# Patient Record
Sex: Female | Born: 1995 | Hispanic: No | Marital: Single | Smoking: Never smoker
Health system: Southern US, Community
[De-identification: ages and names within clinical notes are randomized; demographics above are authoritative.]

## PROBLEM LIST (undated history)

## (undated) DIAGNOSIS — Z789 Other specified health status: Secondary | ICD-10-CM

## (undated) HISTORY — PX: EYE SURGERY: SHX253

---
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
## 2013-03-22 NOTE — L&D Delivery Note (Signed)
Delivery Summary for Erica SimpsonShamara T [name]  Labor Events:   Preterm labor:   Rupture date:   Rupture time:   Rupture type: Artificial  Fluid Color: Clear  Induction:   Augmentation:   Complications:   Cervical ripening:          Delivery:   Episiotomy:   Lacerations: 2nd degree  Repair suture: 3-0 monocryl  Repair # of packets: 1  Blood loss (ml): 200   Information for the patient's newborn:  Erica [name], Erica [name] [[id number]][[id number]]    Delivery 05/27/2013 6:24 AM by  Vaginal, Vacuum (Extractor) Sex:  female Gestational Age: 3029w5d Delivery Clinician:   Living?: Yes        APGARS  One minute Five minutes Ten minutes  Skin color: 2   2      Heart rate: 2   2      Grimace: 1   1      Muscle tone: 1   2      Breathing: 2   2      Totals: 8  9      Presentation/position:      Resuscitation: None  Cord information:    Disposition of cord blood:     Blood gases sent?  Complications:   Placenta: Delivered: 05/27/2013 6:26 AM  Spontaneous  Intact appearance Newborn Measurements: Weight: 7 lb 6.7 oz (3365 g)  Height: 20"  Head circumference: 34.3 cm  Chest circumference: 34.3 cm  Other providers:    Additional  information: Forceps:   Vacuum: Low  Breech:   Observed anomalies       Erica PhenixJames G Dima Mini, MD 05/27/2013

## 2013-05-26 ENCOUNTER — Encounter (HOSPITAL_COMMUNITY): Payer: Medicaid Other | Admitting: Anesthesiology

## 2013-05-26 ENCOUNTER — Inpatient Hospital Stay (HOSPITAL_COMMUNITY): Payer: Medicaid Other | Admitting: Anesthesiology

## 2013-05-26 ENCOUNTER — Inpatient Hospital Stay (HOSPITAL_COMMUNITY)
Admission: AD | Admit: 2013-05-26 | Discharge: 2013-05-28 | DRG: 775 | Disposition: A | Discharge status: Home / Self Care | Payer: Medicaid Other | Source: Ambulatory Visit | Attending: Obstetrics & Gynecology | Admitting: Obstetrics & Gynecology

## 2013-05-26 ENCOUNTER — Encounter (HOSPITAL_COMMUNITY): Payer: Self-pay | Admitting: *Deleted

## 2013-05-26 DIAGNOSIS — O411090 Infection of amniotic sac and membranes, unspecified, unspecified trimester, not applicable or unspecified: Secondary | ICD-10-CM | POA: Diagnosis present

## 2013-05-26 DIAGNOSIS — O9902 Anemia complicating childbirth: Secondary | ICD-10-CM

## 2013-05-26 DIAGNOSIS — D649 Anemia, unspecified: Secondary | ICD-10-CM | POA: Diagnosis present

## 2013-05-26 DIAGNOSIS — IMO0001 Reserved for inherently not codable concepts without codable children: Secondary | ICD-10-CM

## 2013-05-26 HISTORY — DX: Other specified health status: Z78.9

## 2013-05-26 LAB — CBC
HCT: 31.2 % — ABNORMAL LOW (ref 36.0–46.0)
Hemoglobin: 9.3 g/dL — ABNORMAL LOW (ref 12.0–15.0)
MCH: 21.9 pg — AB (ref 26.0–34.0)
MCHC: 29.8 g/dL — ABNORMAL LOW (ref 30.0–36.0)
MCV: 73.4 fL — AB (ref 78.0–100.0)
Platelets: 288 10*3/uL (ref 150–400)
RBC: 4.25 MIL/uL (ref 3.87–5.11)
RDW: 19.6 % — AB (ref 11.5–15.5)
WBC: 13.2 10*3/uL — ABNORMAL HIGH (ref 4.0–10.5)

## 2013-05-26 LAB — RAPID HIV SCREEN (WH-MAU): Rapid HIV Screen: NONREACTIVE

## 2013-05-26 LAB — OB RESULTS CONSOLE GBS: GBS: NEGATIVE

## 2013-05-26 LAB — HIV ANTIBODY (ROUTINE TESTING W REFLEX): HIV: NONREACTIVE

## 2013-05-26 LAB — RPR: RPR Ser Ql: NONREACTIVE

## 2013-05-26 MED ORDER — LACTATED RINGERS IV SOLN: 500.0000 mL | INTRAVENOUS | Status: DC | PRN

## 2013-05-26 MED ORDER — OXYTOCIN BOLUS FROM INFUSION
500.0000 mL | INTRAVENOUS | Status: DC
  Administered 2013-05-27: 500 mL via INTRAVENOUS

## 2013-05-26 MED ORDER — AMPICILLIN SODIUM 2 G IJ SOLR
2.0000 g | Freq: Four times a day (QID) | INTRAMUSCULAR | Status: DC
  Administered 2013-05-26 – 2013-05-27 (×2): 2 g via INTRAVENOUS
  Filled 2013-05-26 (×3): qty 2000

## 2013-05-26 MED ORDER — EPHEDRINE 5 MG/ML INJ: 10.0000 mg | INTRAVENOUS | Status: DC | PRN

## 2013-05-26 MED ORDER — FENTANYL CITRATE 0.05 MG/ML IJ SOLN
100.0000 ug | INTRAMUSCULAR | Status: DC | PRN
  Administered 2013-05-26 (×4): 100 ug via INTRAVENOUS
  Filled 2013-05-26 (×4): qty 2

## 2013-05-26 MED ORDER — FENTANYL 2.5 MCG/ML BUPIVACAINE 1/10 % EPIDURAL INFUSION (WH - ANES)
14.0000 mL/h | INTRAMUSCULAR | Status: DC | PRN
  Administered 2013-05-27: 14 mL/h via EPIDURAL
  Filled 2013-05-26 (×2): qty 125

## 2013-05-26 MED ORDER — LIDOCAINE HCL (PF) 1 % IJ SOLN
INTRAMUSCULAR | Status: DC | PRN
  Administered 2013-05-26 (×2): 4 mL

## 2013-05-26 MED ORDER — GENTAMICIN SULFATE 40 MG/ML IJ SOLN
140.0000 mg | Freq: Three times a day (TID) | INTRAVENOUS | Status: DC
  Filled 2013-05-26 (×2): qty 3.5

## 2013-05-26 MED ORDER — FENTANYL 2.5 MCG/ML BUPIVACAINE 1/10 % EPIDURAL INFUSION (WH - ANES)
INTRAMUSCULAR | Status: DC | PRN
  Administered 2013-05-26: 14 mL/h via EPIDURAL

## 2013-05-26 MED ORDER — CITRIC ACID-SODIUM CITRATE 334-500 MG/5ML PO SOLN: 30.0000 mL | ORAL | Status: DC | PRN

## 2013-05-26 MED ORDER — PHENYLEPHRINE 40 MCG/ML (10ML) SYRINGE FOR IV PUSH (FOR BLOOD PRESSURE SUPPORT): 80.0000 ug | PREFILLED_SYRINGE | INTRAVENOUS | Status: DC | PRN

## 2013-05-26 MED ORDER — LACTATED RINGERS IV SOLN
500.0000 mL | Freq: Once | INTRAVENOUS | Status: AC
  Administered 2013-05-26: 500 mL via INTRAVENOUS

## 2013-05-26 MED ORDER — OXYTOCIN 40 UNITS IN LACTATED RINGERS INFUSION - SIMPLE MED
62.5000 mL/h | INTRAVENOUS | Status: DC
Start: 2013-05-26 — End: 2013-05-27
  Filled 2013-05-26: qty 1000

## 2013-05-26 MED ORDER — GENTAMICIN SULFATE 40 MG/ML IJ SOLN
160.0000 mg | Freq: Once | INTRAVENOUS | Status: AC
  Administered 2013-05-26: 160 mg via INTRAVENOUS
  Filled 2013-05-26: qty 4

## 2013-05-26 MED ORDER — OXYCODONE-ACETAMINOPHEN 5-325 MG PO TABS: 1.0000 | ORAL_TABLET | ORAL | Status: DC | PRN

## 2013-05-26 MED ORDER — ACETAMINOPHEN 325 MG PO TABS
650.0000 mg | ORAL_TABLET | ORAL | Status: DC | PRN
  Administered 2013-05-26: 650 mg via ORAL
  Filled 2013-05-26: qty 2

## 2013-05-26 MED ORDER — PHENYLEPHRINE 40 MCG/ML (10ML) SYRINGE FOR IV PUSH (FOR BLOOD PRESSURE SUPPORT)
80.0000 ug | PREFILLED_SYRINGE | INTRAVENOUS | Status: DC | PRN
  Filled 2013-05-26: qty 10

## 2013-05-26 MED ORDER — LIDOCAINE HCL (PF) 1 % IJ SOLN
30.0000 mL | INTRAMUSCULAR | Status: DC | PRN
  Filled 2013-05-26 (×2): qty 30

## 2013-05-26 MED ORDER — LACTATED RINGERS IV SOLN
INTRAVENOUS | Status: DC
  Administered 2013-05-26 (×2): via INTRAVENOUS

## 2013-05-26 MED ORDER — EPHEDRINE 5 MG/ML INJ
10.0000 mg | INTRAVENOUS | Status: DC | PRN
  Filled 2013-05-26: qty 4

## 2013-05-26 MED ORDER — DIPHENHYDRAMINE HCL 50 MG/ML IJ SOLN: 12.5000 mg | INTRAMUSCULAR | Status: DC | PRN

## 2013-05-26 MED ORDER — ONDANSETRON HCL 4 MG/2ML IJ SOLN: 4.0000 mg | Freq: Four times a day (QID) | INTRAMUSCULAR | Status: DC | PRN

## 2013-05-26 MED ORDER — IBUPROFEN 600 MG PO TABS: 600.0000 mg | ORAL_TABLET | Freq: Four times a day (QID) | ORAL | Status: DC | PRN

## 2013-05-26 MED ORDER — FENTANYL 2.5 MCG/ML BUPIVACAINE 1/10 % EPIDURAL INFUSION (WH - ANES): 14.0000 mL/h | INTRAMUSCULAR | Status: DC | PRN

## 2013-05-26 MED ORDER — DEXTROSE 5 % IV SOLN
140.0000 mg | Freq: Three times a day (TID) | INTRAVENOUS | Status: DC
  Filled 2013-05-26 (×3): qty 3.5

## 2013-05-26 NOTE — Progress Notes (Signed)
Erica SimpsonShamara T [name] is a 18 y.o. G1P0 at 5859w4d by LMP admitted for active labor  Subjective: Pt having significant pain and rec'd fent x2  Objective: BP 144/88  Pulse 90  Temp(Src) 97.7 F (36.5 C) (Oral)  Resp 18  Ht 5\' 2"  (1.575 m)  Wt 67.132 kg (148 lb)  BMI 27.06 kg/m2      FHT:  FHR: 150s bpm, variability: moderate,  accelerations:  Abscent,  decelerations:  Absent UC:   regular, every 2-3 minutes SVE:   Dilation: 8.5 Effacement (%): 100 Station: -1 Exam by:: patti moore rn Minimal change since last check Labs: Lab Results  Component Value Date   WBC 13.2* 05/26/2013   HGB 9.3* 05/26/2013   HCT 31.2* 05/26/2013   MCV 73.4* 05/26/2013   PLT 288 05/26/2013    Assessment / Plan: Spontaneous labor, progressing normally s/p AROM clear fluid  Labor: s/p AROM, pt currently sleeping after fent, hopefully will relax to allow descent, once awake will need to check cervix, consider placement of IUPC if no change Preeclampsia:  no signs or symptoms of toxicity Fetal Wellbeing:  Category I Pain Control:  Labor support without medications I/D:  GBS Neg. No ABx Anticipated MOD:  NSVD  Erica [name] Erica [name] 05/26/2013, 5:40 PM

## 2013-05-26 NOTE — Progress Notes (Signed)
Erica SimpsonShamara T [name] is a 18 y.o. G1P0 at 6364w4d by LMP admitted for active labor  Subjective: Pt doing well without complaints. Breathing through contractions.  Objective: BP 135/75  Pulse 95  Temp(Src) 98.2 F (36.8 C) (Oral)  Resp 18  Ht 5\' 2"  (1.575 m)  Wt 67.132 kg (148 lb)  BMI 27.06 kg/m2      FHT:  FHR: 140s bpm, variability: moderate,  accelerations:  Abscent,  decelerations:  Present occasional early decel. not recurrent UC:   regular, every 2-3 minutes SVE:   Dilation: 8 Effacement (%): 80 Station: -1 Exam by:: dr Tavious Griesinger  Labs: Lab Results  Component Value Date   WBC 13.2* 05/26/2013   HGB 9.3* 05/26/2013   HCT 31.2* 05/26/2013   MCV 73.4* 05/26/2013   PLT 288 05/26/2013    Assessment / Plan: Spontaneous labor, progressing normally s/p AROM clear fluid  Labor: Progressing normally and s/p AROM with clear fluid. expectant management Preeclampsia:  no signs or symptoms of toxicity Fetal Wellbeing:  Category I Pain Control:  Labor support without medications I/D:  GBS Neg. No ABx Anticipated MOD:  NSVD  Erica [name] 05/26/2013, 3:06 PM

## 2013-05-26 NOTE — Progress Notes (Signed)
ANTIBIOTIC CONSULT NOTE - INITIAL  Pharmacy Consult for gentamicin Indication: chorioamnionitis  No Known Allergies  Patient Measurements: Height: 5\' 2"  (157.5 cm) Weight: 148 lb (67.132 kg) IBW/kg (Calculated) : 50.1 Adjusted Body Weight: 55.8kg  Vital Signs: Temp: 100.5 F (38.1 C) (03/07 2254) Temp src: Axillary (03/07 2254) BP: 93/58 mmHg (03/07 2300) Pulse Rate: 119 (03/07 2300) Intake/Output from previous day:   Intake/Output from this shift: Total I/O In: -  Out: 300 [Urine:300]  Labs:  Recent Labs  05/26/13 1245  WBC 13.2*  HGB 9.3*  PLT 288   CrCl is unknown because no creatinine reading has been taken.  Assumed CrCl for age and pregnancy is 130-14740ml/min. No results found for this basename: VANCOTROUGH, Leodis BinetVANCOPEAK, VANCORANDOM, GENTTROUGH, GENTPEAK, GENTRANDOM, TOBRATROUGH, TOBRAPEAK, TOBRARND, AMIKACINPEAK, AMIKACINTROU, AMIKACIN,  in the last 72 hours   Microbiology: Recent Results (from the past 720 hour(s))  OB RESULTS CONSOLE GBS     Status: None   Collection Time    05/26/13  9:21 AM      Result Value Ref Range Status   GBS Negative   Final    Medical History: Past Medical History  Diagnosis Date  . Medical history non-contributory     Medications:  Scheduled:  . [START ON 05/27/2013] ampicillin (OMNIPEN) IV  2 g Intravenous 4 times per day  . [START ON 05/27/2013] gentamicin  140 mg Intravenous Q8H  . gentamicin  160 mg Intravenous Once   Infusions:  . fentaNYL 2.5 mcg/ml w/bupivacaine 1/10% in NS 100ml epidural infusion (125ml total)    . lactated ringers 125 mL/hr at 05/26/13 2104  . oxytocin 40 units in LR 1000 mL    . oxytocin 40 units in LR 1000 mL     PRN: acetaminophen, citric acid-sodium citrate, diphenhydrAMINE, ePHEDrine, ePHEDrine, fentaNYL, fentaNYL 2.5 mcg/ml w/bupivacaine 1/10% in NS 100ml epidural infusion (125ml total), ibuprofen, lactated ringers, lidocaine (PF), ondansetron, oxyCODONE-acetaminophen, phenylephrine,  phenylephrine  Assessment: 2537yr G1P0 term pregnancy, now with sx's of possible chorioamnionitis.  Ampicillin and gentamicin tx to be started.  Goal of Therapy:  Desire gentamicin peak serum level to be 6-718mcg/ml and trough to be <551mcg/ml.  Plan:  1.  Gentamicin 160mg  loading dose IV x 1 2. (if continued) gentamicin 140mg  IV q8h 3.  Serum creatinine later in morning if maintenance gentamicin tx continued 4.  Measure actual serum levels if tx continued >48-72hr , or as clinically indicated  Scarlett PrestoRochette, Arn Mcomber E 05/26/2013,11:33 PM

## 2013-05-26 NOTE — Anesthesia Preprocedure Evaluation (Signed)
Anesthesia Evaluation  Patient identified by MRN, date of birth, ID band Patient awake    Reviewed: Allergy & Precautions, H&P , NPO status , Patient's Chart, lab work & pertinent test results  Airway Mallampati: II TM Distance: >3 FB     Dental   Pulmonary neg pulmonary ROS,          Cardiovascular negative cardio ROS  Rhythm:Regular     Neuro/Psych negative neurological ROS  negative psych ROS   GI/Hepatic negative GI ROS, Neg liver ROS,   Endo/Other  negative endocrine ROS  Renal/GU negative Renal ROS     Musculoskeletal negative musculoskeletal ROS (+)   Abdominal   Peds  Hematology negative hematology ROS (+)   Anesthesia Other Findings   Reproductive/Obstetrics (+) Pregnancy                           Anesthesia Physical Anesthesia Plan  ASA: II  Anesthesia Plan: Epidural   Post-op Pain Management:    Induction:   Airway Management Planned:   Additional Equipment:   Intra-op Plan:   Post-operative Plan:   Informed Consent: I have reviewed the patients History and Physical, chart, labs and discussed the procedure including the risks, benefits and alternatives for the proposed anesthesia with the patient or authorized representative who has indicated his/her understanding and acceptance.     Plan Discussed with:   Anesthesia Plan Comments:         Anesthesia Quick Evaluation  

## 2013-05-26 NOTE — Anesthesia Procedure Notes (Signed)
Epidural Patient location during procedure: OB Start time: 05/26/2013 9:00 PM End time: 05/26/2013 9:10 PM  Staffing Anesthesiologist: Lewie LoronGERMEROTH, Donnelle Olmeda R Performed by: anesthesiologist   Preanesthetic Checklist Completed: patient identified, pre-op evaluation, timeout performed, IV checked, risks and benefits discussed and monitors and equipment checked  Epidural Patient position: sitting Prep: site prepped and draped and DuraPrep Patient monitoring: heart rate Approach: midline Injection technique: LOR air and LOR saline  Needle:  Needle type: Tuohy  Needle gauge: 17 G Needle length: 9 cm Needle insertion depth: 5 cm Catheter type: closed end flexible Catheter size: 19 Gauge Catheter at skin depth: 11 cm Test dose: negative  Assessment Sensory level: T8 Events: blood not aspirated, injection not painful, no injection resistance, negative IV test and no paresthesia  Additional Notes Reason for block:procedure for pain

## 2013-05-26 NOTE — Progress Notes (Signed)
Patient ID: Shona SimpsonShamara T Delaluz, female   DOB: Feb 01, 1996, 18 y.o.   MRN: [id number] Doing well, sleeping  FHR reassuring UCs every 2 min  Dilation: 10 Dilation Complete Date: 05/26/13 Dilation Complete Time: 2211 Effacement (%): 100 Cervical Position: Anterior Station: 0 Presentation: Vertex Exam by:: Wynelle BourgeoisMarie Gia Lusher  Will have her labor down until vertex lower

## 2013-05-26 NOTE — H&P (Signed)
Erica SimpsonShamara T [name] is a 18 y.o. female G1P0 with IUP at 2365w4d presenting for active labor. Pt states she has been having regular, every 3 minutes contractions since this morning at 5 am, associated with none vaginal bleeding.  Membranes are intact, with active fetal movement.   [facility] at [facility] since 11/17 Prenatal History/Complications: Mild anemia of pregnancy  Past Medical History: History reviewed. No pertinent past medical history.  Past Surgical History: Past Surgical History  Procedure Laterality Date  . Eye surgery      Obstetrical History: OB History   Grav Para Term Preterm Abortions TAB SAB Ect Mult Living   1               Gynecological History: OB History   Grav Para Term Preterm Abortions TAB SAB Ect Mult Living   1               Social History: History   Social History  . Marital Status: Single    Spouse Name: N/A    Number of Children: N/A  . Years of Education: N/A   Social History Main Topics  . Smoking status: Never Smoker   . Smokeless tobacco: Never Used  . Alcohol Use: No  . Drug Use: No  . Sexual Activity: None   Other Topics Concern  . None   Social History Narrative  . None    Family History: No family history on file.  Allergies: No Known Allergies  Prescriptions prior to admission  Medication Sig Dispense Refill  . Prenatal Vit-Fe Fumarate-FA (PRENATAL MULTIVITAMIN) TABS tablet Take 1 tablet by mouth daily at 12 noon.         Review of Systems   Constitutional: no complaints at this time  Blood pressure 116/73, pulse 68, temperature 97.1 F (36.2 C), temperature source Oral, resp. rate 18. General appearance: alert, cooperative, appears stated age and no distress Lungs: clear to auscultation bilaterally Heart: regular rate and rhythm Abdomen: soft, non-tender; bowel sounds normal Pelvic: adequate Extremities: Homans sign is negative, no sign of DVT  Presentation: cephalic Fetal monitoringBaseline: 140s bpm,  Variability: Good {> 6 bpm), Accelerations: none and Decelerations: Absent Uterine activity reg q683min  Dilation: 5 Effacement (%): 60 Station: -3 Exam by:: Dr Ike Benedom   Prenatal labs: ABO, Rh:   A+ Antibody:  Neg Rubella:  immune RPR:   NR HBsAg:   Neg HIV:   NR GBS: Negative (03/07 0921)  1hr 148, 3 hr Glucola 80/114/127/112 Genetic screening  neg Anatomy US wnl   Prenatal Transfer Tool  Maternal Diabetes: No Genetic Screening: Normal Maternal Ultrasounds/Referrals: Normal Fetal Ultrasounds or other Referrals:  None Maternal Substance Abuse:  No Significant Maternal Medications:  None Significant Maternal Lab Results: Lab values include: Group B Strep negative  Results for orders placed during the hospital encounter of 05/26/13 (from the past 24 hour(s))  OB RESULTS CONSOLE GBS   Collection Time    05/26/13  9:21 AM      Result Value Ref Range   GBS Negative      Assessment: Erica [name] is a 18 y.o. G1P0 at 6365w4d by L=6 here for SOOL #Labor: expectant management #Pain: Desires all natural labor. Reviewed pain control options #FWB:  Cat I #ID:  GBS neg #MOF: Breast #Circ:  As outpatient  [name] RYAN 05/26/2013, 12:36 PM

## 2013-05-26 NOTE — MAU Note (Signed)
Contracting for about 4 hours.  Denies fluid leaking and vaginal bleeding.

## 2013-05-26 NOTE — Progress Notes (Signed)
Shona SimpsonShamara T Trulock is a 18 y.o. G1P0 at 3849w4d by ultrasound admitted for active labor  Subjective: Getting ready to get epidural now.  Objective: BP 140/98  Pulse 94  Temp(Src) 98.2 F (36.8 C) (Oral)  Resp 18  Ht 5\' 2"  (1.575 m)  Wt 67.132 kg (148 lb)  BMI 27.06 kg/m2      FHT:  FHR: 135 bpm, variability: moderate,  accelerations:  Present,  decelerations:  Absent UC:   regular, every 2 minutes SVE:   Dilation: 8.5 Effacement (%): 100 Station: -1 Exam by:: Veronica Mensah  Labs: Lab Results  Component Value Date   WBC 13.2* 05/26/2013   HGB 9.3* 05/26/2013   HCT 31.2* 05/26/2013   MCV 73.4* 05/26/2013   PLT 288 05/26/2013    Assessment / Plan: Spontaneous labor, progressing normally  Labor: Progressing normally Preeclampsia:  n/a Fetal Wellbeing:  Category I Pain Control:  Epidural I/D:  n/a Anticipated MOD:  NSVD  Harmoni Lucus 05/26/2013, 9:01 PM

## 2013-05-27 ENCOUNTER — Encounter (HOSPITAL_COMMUNITY): Payer: Self-pay | Admitting: *Deleted

## 2013-05-27 MED ORDER — ONDANSETRON HCL 4 MG PO TABS: 4.0000 mg | ORAL_TABLET | ORAL | Status: DC | PRN

## 2013-05-27 MED ORDER — TETANUS-DIPHTH-ACELL PERTUSSIS 5-2.5-18.5 LF-MCG/0.5 IM SUSP: 0.5000 mL | Freq: Once | INTRAMUSCULAR | Status: DC

## 2013-05-27 MED ORDER — IBUPROFEN 600 MG PO TABS: 600.0000 mg | ORAL_TABLET | Freq: Four times a day (QID) | ORAL | Status: DC

## 2013-05-27 MED ORDER — BENZOCAINE-MENTHOL 20-0.5 % EX AERO
1.0000 "application " | INHALATION_SPRAY | CUTANEOUS | Status: DC | PRN
  Administered 2013-05-27: 1 via TOPICAL
  Filled 2013-05-27 (×2): qty 56

## 2013-05-27 MED ORDER — WITCH HAZEL-GLYCERIN EX PADS: 1.0000 "application " | MEDICATED_PAD | CUTANEOUS | Status: DC | PRN

## 2013-05-27 MED ORDER — ONDANSETRON HCL 4 MG/2ML IJ SOLN: 4.0000 mg | INTRAMUSCULAR | Status: DC | PRN

## 2013-05-27 MED ORDER — ZOLPIDEM TARTRATE 5 MG PO TABS: 5.0000 mg | ORAL_TABLET | Freq: Every evening | ORAL | Status: DC | PRN

## 2013-05-27 MED ORDER — PRENATAL MULTIVITAMIN CH: 1.0000 | ORAL_TABLET | Freq: Every day | ORAL | Status: DC

## 2013-05-27 MED ORDER — LANOLIN HYDROUS EX OINT: TOPICAL_OINTMENT | CUTANEOUS | Status: DC | PRN

## 2013-05-27 MED ORDER — SIMETHICONE 80 MG PO CHEW: 80.0000 mg | CHEWABLE_TABLET | ORAL | Status: DC | PRN

## 2013-05-27 MED ORDER — COMPLETENATE 29-1 MG PO CHEW
1.0000 | CHEWABLE_TABLET | Freq: Every day | ORAL | Status: DC
  Administered 2013-05-27 – 2013-05-28 (×2): 1 via ORAL
  Filled 2013-05-27 (×3): qty 1

## 2013-05-27 MED ORDER — DIPHENHYDRAMINE HCL 25 MG PO CAPS: 25.0000 mg | ORAL_CAPSULE | Freq: Four times a day (QID) | ORAL | Status: DC | PRN

## 2013-05-27 MED ORDER — DIBUCAINE 1 % RE OINT
1.0000 "application " | TOPICAL_OINTMENT | RECTAL | Status: DC | PRN
  Filled 2013-05-27: qty 28

## 2013-05-27 MED ORDER — OXYTOCIN 40 UNITS IN LACTATED RINGERS INFUSION - SIMPLE MED
1.0000 m[IU]/min | INTRAVENOUS | Status: DC
  Administered 2013-05-27: 2 m[IU]/min via INTRAVENOUS

## 2013-05-27 MED ORDER — TERBUTALINE SULFATE 1 MG/ML IJ SOLN: 0.2500 mg | Freq: Once | INTRAMUSCULAR | Status: DC | PRN

## 2013-05-27 MED ORDER — OXYCODONE-ACETAMINOPHEN 5-325 MG PO TABS: 1.0000 | ORAL_TABLET | ORAL | Status: DC | PRN

## 2013-05-27 MED ORDER — IBUPROFEN 100 MG/5ML PO SUSP
600.0000 mg | Freq: Four times a day (QID) | ORAL | Status: DC
  Administered 2013-05-27 – 2013-05-28 (×4): 600 mg via ORAL
  Filled 2013-05-27 (×9): qty 30

## 2013-05-27 MED ORDER — SENNOSIDES-DOCUSATE SODIUM 8.6-50 MG PO TABS
2.0000 | ORAL_TABLET | ORAL | Status: DC
  Filled 2013-05-27: qty 2

## 2013-05-27 NOTE — Progress Notes (Signed)
Patient ID: Erica SimpsonShamara T Goga, female   DOB: [birth year], 18 y.o.   MRN: [id number]  Sleeping now.  RN states patient's effort became less and less and pushing was ineffective  So she had her stop and rest.  FHR is 140-150 with accels to 165 and occasional small variable decels to 130  Vertex is ROP, by my exam.   Discussed with Dr Debroah LoopArnold.  Will restart pushing when pt able.

## 2013-05-27 NOTE — Lactation Note (Signed)
This note was copied from the chart of Erica Shaune SpittleShamara Winberry. Lactation Consultation Note  Patient Name: Erica [name] AVWUJ'WToday's Date: 05/27/2013 Reason for consult: Initial assessment RN initiated a #20 nipple shield because baby was not able to obtain or sustain a latch. Baby has nursed on the left breast before I arrived, this nipple is erect, short nipple shaft. Mom used the nipple shield. Mom is wearing her shells on the right breast, but LC notes some aerola edema at the base of the nipple and advised Mom not to wear the shells. Attempted to latch baby to the right breast without the nipple shield, however baby could not latch. Applied nipple shield and after few attempts baby latched and demonstrated a good rhythmic suck. Some colostrum visible in the nipple shield at the end of the feeding. Mom was then able to re-latch baby without assist. BF basics reviewed with Mom. Lactation brochure left for review. Advised of OP services and support group. Advised Mom to ask for assist as needed.   Maternal Data Formula Feeding for Exclusion: No Infant to breast within first hour of birth: No Breastfeeding delayed due to:: Other (comment) (Mom reports baby not interested in BF in 1st hour) Has patient been taught Hand Expression?: Yes Does the patient have breastfeeding experience prior to this delivery?: No  Feeding Feeding Type: Breast Fed  LATCH Score/Interventions Latch: Repeated attempts needed to sustain latch, nipple held in mouth throughout feeding, stimulation needed to elicit sucking reflex. (used #20 nipple shield intiated by RN) Intervention(s): Adjust position;Assist with latch  Audible Swallowing: A few with stimulation  Type of Nipple: Everted at rest and after stimulation (aerola edema) Intervention(s): Shells;Hand pump  Comfort (Breast/Nipple): Soft / non-tender     Hold (Positioning): Assistance needed to correctly position infant at breast and maintain  latch. Intervention(s): Breastfeeding basics reviewed;Support Pillows;Position options;Skin to skin  LATCH Score: 7  Lactation Tools Discussed/Used Tools: Pump;Shells;Nipple Shields Nipple shield size: 20 Shell Type: Inverted Breast pump type: Manual WIC Program: Yes   Consult Status Consult Status: Follow-up Date: 05/28/13 Follow-up type: In-patient    Alfred LevinsGranger, [name] Ann 05/27/2013, 7:42 PM

## 2013-05-27 NOTE — Progress Notes (Signed)
Patient ID: Erica SimpsonShamara T [name], female   DOB: 21-Feb-1996, 18 y.o.   MRN: [id number] Doing well but UCs are spacing out  FHR reassuring UCs spaced out to q4 min  Pitocin augmentation ordered  Will continue pushing and anticipate SVD

## 2013-05-27 NOTE — Progress Notes (Signed)
Clinical Social Work Department PSYCHOSOCIAL ASSESSMENT - MATERNAL/CHILD 05/27/2013  Patient:  Erica [name], Erica [name]  Account Number:  [id number]  Admit Date:  05/26/2013  Ardine Eng Name:   Erica [name]    Clinical Social Worker:  Braeley Buskey, LCSW   Date/Time:  05/27/2013 01:00 PM  Date Referred:  05/27/2013   Referral source  Central Nursery     Referred reason  Substance Abuse   Other referral source:    I:  FAMILY / HOME ENVIRONMENT Child's legal guardian:  PARENT  Guardian - Name Guardian - Age Guardian - Address  Erica [name] [address]  [place], [place] [address]  Erica [name] 18    Other household support members/support persons Other support:    II  PSYCHOSOCIAL DATA Information Source:    Occupational hygienist Employment:   Supported by Maternal grandmother   Museum/gallery curator resources:  Medicaid If [place]:   Other  [place] / Grade:   Maternity Care Coordinator / Child Services Coordination / Early Interventions:  Cultural issues impacting care:    III  STRENGTHS Strengths  Supportive family/friends  Home prepared for Child (including basic supplies)  Adequate Resources   Strength comment:    IV  RISK FACTORS AND CURRENT PROBLEMS Current Problem:     Risk Factor & Current Problem Patient Issue Family Issue Risk Factor / Current Problem Comment  Substance Abuse Y N Hx oj Marijuana use    V  SOCIAL WORK ASSESSMENT Acknowledged Social Work consult to assess mother's history of marijuana use.  She is a 18 year old single parent with no other dependents.  Initially met with mother and she requested this writer return to speak to her when her mother was present.  This Probation officer returned as requested. Maternal grandmother wanted to know reason for the consult. Informed her that the reason for the consult was that there was a hx of marijuana use during pregnancy.  Grandmother seemed irate and insisted that her daughter did not use  any marijuana while pregnant.  Mother did not respond to questions about marijuana use during the pregnancy. Grandmother irritated with CSW consult.  It appears that FOB is not involved.   Mother states that she graduated high school and started college but stopped because of the pregnancy.  She communicate desire to continue her education and was encouraged to do so.  Spoke with her regarding family planning to avoid future unplanned pregnancies.  Patient looked depressed during [place] visit. Questioned her about this, and she started to cry. Grandmother stated that mother was upset because of the social work consult and possibly having her child taken away from her because of the marijuana.  Explained the hospital's drug screening policy and what usually takes place if a report is made to DSS because of a positive UDS. Assured her that based on this writer's experience, it is not likely that newborn will be removed from her custody based on the information they provided.  The family seemed worried and remained upset.  UDS on newborn is still pending.     Mother denied any hx of mental illness.      VI SOCIAL WORK PLAN Social Work Plan  No Barriers to Discharge   Type of pt/family education:   If child protective services report - county:   If child protective services report - date:   Information/referral to community resources comment:   Other social work plan:   Will monitor drug screen.

## 2013-05-27 NOTE — Anesthesia Postprocedure Evaluation (Signed)
Anesthesia Post Note  Patient: Erica SimpsonShamara T [name]  Procedure(s) Performed: * No procedures listed *  Anesthesia type: Epidural  Patient location: Mother/Baby  Post pain: Pain level controlled  Post assessment: Post-op Vital signs reviewed  Last Vitals:  Filed Vitals:   05/27/13 0800  BP: 128/68  Pulse: 111  Temp:   Resp:     Post vital signs: Reviewed  Level of consciousness: awake  Complications: No apparent anesthesia complications

## 2013-05-28 ENCOUNTER — Ambulatory Visit: Payer: Self-pay

## 2013-05-28 MED ORDER — IBUPROFEN 100 MG/5ML PO SUSP: 600.0000 mg | Freq: Four times a day (QID) | ORAL | Status: DC

## 2013-05-28 NOTE — Lactation Note (Signed)
This note was copied from the chart of Erica Shaune SpittleShamara Vetsch. Lactation Consultation Note Called to room for latch assist, but baby unlatched after 20 minute feeding.  Mom reports struggle getting baby latched, but then did without use of nipple shield as previously needed.  Mom reports a good feeding without nipple pain.  Encouraged mom to hand express and use hand pump to assist with latch and feeding.  Basics discussed at length and encouraged mom with her efforts.  Mom to call for assist as needed and LC to see prior to discharge.   Patient Name: Erica [name] Erica [name] Reason for consult: Follow-up assessment   Maternal Data    Feeding Feeding Type: Breast Fed Length of feed: 20 min  LATCH Score/Interventions Latch: Grasps breast easily, tongue down, lips flanged, rhythmical sucking. Intervention(s): Teach feeding cues;Skin to skin Intervention(s): Breast compression  Audible Swallowing: A few with stimulation Intervention(s): Skin to skin  Type of Nipple: Everted at rest and after stimulation Intervention(s): Hand pump  Comfort (Breast/Nipple): Soft / non-tender     Hold (Positioning): Assistance needed to correctly position infant at breast and maintain latch. Intervention(s): Breastfeeding basics reviewed;Support Pillows;Position options;Skin to skin  LATCH Score: 8  Lactation Tools Discussed/Used Tools: Nipple Dorris CarnesShields   Consult Status Consult Status: Follow-up Date: 05/29/13 Follow-up type: In-patient    Erica [name], Erica [name] [name], 10:25 PM

## 2013-05-28 NOTE — Discharge Summary (Signed)
Obstetric Discharge Summary Reason for Admission: onset of labor Prenatal Procedures: none Intrapartum Procedures: vacuum and amp/gent for chorio Postpartum Procedures: none Complications-Operative and Postpartum: 2nd degree perineal laceration Hemoglobin  Date Value Ref Range Status  05/26/2013 9.3* 12.0 - 15.0 g/dL Final     HCT  Date Value Ref Range Status  05/26/2013 31.2* 36.0 - 46.0 % Final    Discharge Diagnoses: Term Pregnancy-delivered and Chorioamnionitis  Hospital Course:  Erica SimpsonShamara T [name] is a 18 y.o. G1P1001 who presented with active labor. She developed chorioamnionitis and was treated with amp/gent.  She had a vacuum assisted vaginal delivery. She was able to ambulate, tolerate PO and void normally. She was discharged home with instructions for postpartum care.    Delivery Summary for Erica [name]  Labor Events:   Preterm labor:   Rupture date:   Rupture time:   Rupture type: Artificial  Fluid Color: Clear  Induction:   Augmentation:   Complications:   Cervical ripening:          Delivery:   Episiotomy:   Lacerations: 2nd degree  Repair suture: 3-0 monocryl  Repair # of packets: 1  Blood loss (ml): 200   Information for the patient's newborn:  Erica [name], Erica [name] [[id number]][[id number]]    Delivery 05/27/2013 6:24 AM by  Vaginal, Vacuum (Extractor) Sex:  female Gestational Age: 6670w5d Delivery Clinician:   Living?: Yes        APGARS  One minute Five minutes Ten minutes  Skin color: 2   2      Heart rate: 2   2      Grimace: 1   1      Muscle tone: 1   2      Breathing: 2   2      Totals: 8  9      Presentation/position:      Resuscitation: None  Cord information:    Disposition of cord blood:     Blood gases sent?  Complications:   Placenta: Delivered: 05/27/2013 6:26 AM  Spontaneous  Intact appearance Newborn Measurements: Weight: 7 lb 6.7 oz (3365 g)  Height: 20"  Head circumference: 34.3 cm  Chest circumference: 34.3 cm  Other providers:     Additional  information: Forceps:   Vacuum: Low  Breech:   Observed anomalies    Adam PhenixJames G Arnold, MD 05/27/2013    Physical Exam:  General: alert, cooperative and no distress Lochia: appropriate Uterine Fundus: firm DVT Evaluation: No evidence of DVT seen on physical exam. Negative Homan's sign. No cords or calf tenderness.  Discharge Information: Date: 05/28/2013 Activity: pelvic rest Diet: routine Medications: Ibuprofen, PNV Baby feeding: plans to breastfeed Contraception: considering her options Condition: stable Instructions: refer to practice specific booklet Discharge to: home Follow-up Information   [facility] [place]. Schedule an appointment as soon as possible for a visit in 6 weeks. (For postpartum visit and birth control discussion)    Contact information:   [address] [place] [id number] [phone]      Newborn Data: Live born female  Birth Weight: 7 lb 6.7 oz (3365 g) APGAR: 8, 9  Home with mother.  Wenda LowJames Joyner, MD [facility] PGY-1 05/28/2013, 9:15 AM   I spoke with and examined patient and agree with resident's note and plan of care.  Tawana ScaleMichael Ryan Nica Friske, MD OB Fellow 05/28/2013 9:45 AM

## 2013-05-28 NOTE — Lactation Note (Signed)
This note was copied from the chart of Erica [name] Bredeson. Lactation Consultation Note Follow up visit at 39 hours mom requesting latch assist.  Mom used hand pump to help stimulate nipples and baby is fussy.   Instructed to place baby in football hold.  Baby latched a few times shallow and instructed mom to break suction with her finger to unlatch baby.  Baby sucks his own lips, but does open wide with flanged lips for deep latch.  Minimal assistance needed with encouragement offered.  Mom is quiet and avoids eye contact and conversation.  MGM in room and speaks up for mom.  Audible swallows with rhythmic suckling noted.  Discussed burping and encouraged nipple shield as needed due to previous use.  Mom to call for assist as needed.   Patient Name: Erica [name] [place] RUEAV'WToday's Date: 05/28/2013 Reason for consult: Follow-up assessment   Maternal Data    Feeding Feeding Type: Breast Fed  LATCH Score/Interventions Latch: Grasps breast easily, tongue down, lips flanged, rhythmical sucking. Intervention(s): Teach feeding cues;Skin to skin Intervention(s): Breast compression  Audible Swallowing: A few with stimulation Intervention(s): Skin to skin  Type of Nipple: Everted at rest and after stimulation Intervention(s): Hand pump  Comfort (Breast/Nipple): Soft / non-tender     Hold (Positioning): Assistance needed to correctly position infant at breast and maintain latch. Intervention(s): Breastfeeding basics reviewed;Support Pillows;Position options;Skin to skin  LATCH Score: 8  Lactation Tools Discussed/Used Tools: Nipple Dorris CarnesShields   Consult Status Consult Status: Follow-up Date: 05/29/13 Follow-up type: In-patient    Jannifer RodneyShoptaw, Jana Lynn 05/28/2013, 10:21 PM

## 2013-05-28 NOTE — Discharge Instructions (Signed)
Postpartum Care After Vaginal Delivery °After you deliver your newborn (postpartum period), the usual stay in the hospital is 24 72 hours. If there were problems with your labor or delivery, or if you have other medical problems, you might be in the hospital longer.  °While you are in the hospital, you will receive help and instructions on how to care for yourself and your newborn during the postpartum period.  °While you are in the hospital: °· Be sure to tell your nurses if you have pain or discomfort, as well as where you feel the pain and what makes the pain worse. °· If you had an incision made near your vagina (episiotomy) or if you had some tearing during delivery, the nurses may put ice packs on your episiotomy or tear. The ice packs may help to reduce the pain and swelling. °· If you are breastfeeding, you may feel uncomfortable contractions of your uterus for a couple of weeks. This is normal. The contractions help your uterus get back to normal size. °· It is normal to have some bleeding after delivery. °· For the first 1 3 days after delivery, the flow is red and the amount may be similar to a period. °· It is common for the flow to start and stop. °· In the first few days, you may pass some small clots. Let your nurses know if you begin to pass large clots or your flow increases. °· Do not  flush blood clots down the toilet before having the nurse look at them. °· During the next 3 10 days after delivery, your flow should become more watery and pink or brown-tinged in color. °· Ten to fourteen days after delivery, your flow should be a small amount of yellowish-white discharge. °· The amount of your flow will decrease over the first few weeks after delivery. Your flow may stop in 6 8 weeks. Most women have had their flow stop by 12 weeks after delivery. °· You should change your sanitary pads frequently. °· Wash your hands thoroughly with soap and water for at least 20 seconds after changing pads, using  the toilet, or before holding or feeding your newborn. °· You should feel like you need to empty your bladder within the first 6 8 hours after delivery. °· In case you become weak, lightheaded, or faint, call your nurse before you get out of bed for the first time and before you take a shower for the first time. °· Within the first few days after delivery, your breasts may begin to feel tender and full. This is called engorgement. Breast tenderness usually goes away within 48 72 hours after engorgement occurs. You may also notice milk leaking from your breasts. If you are not breastfeeding, do not stimulate your breasts. Breast stimulation can make your breasts produce more milk. °· Spending as much time as possible with your newborn is very important. During this time, you and your newborn can feel close and get to know each other. Having your newborn stay in your room (rooming in) will help to strengthen the bond with your newborn.  It will give you time to get to know your newborn and become comfortable caring for your newborn. °· Your hormones change after delivery. Sometimes the hormone changes can temporarily cause you to feel sad or tearful. These feelings should not last more than a few days. If these feelings last longer than that, you should talk to your caregiver. °· If desired, talk to your caregiver about   methods of family planning or contraception.  Talk to your caregiver about immunizations. Your caregiver may want you to have the following immunizations before leaving the hospital:  Tetanus, diphtheria, and pertussis (Tdap) or tetanus and diphtheria (Td) immunization. It is very important that you and your family (including grandparents) or others caring for your newborn are up-to-date with the Tdap or Td immunizations. The Tdap or Td immunization can help protect your newborn from getting ill.  Rubella immunization.  Varicella (chickenpox) immunization.  Influenza immunization. You should  receive this annual immunization if you did not receive the immunization during your pregnancy. Document Released: 01/03/2007 Document Revised: 12/01/2011 Document Reviewed: 11/03/2011 [facility] Patient Information 2014 [place], [place].  Contraception Choices Birth control (contraception) is the use of any methods or devices to stop pregnancy from happening. Below are some methods to help avoid pregnancy. HORMONAL BIRTH CONTROL  A small tube put under the skin of the upper arm (implant). The tube can stay in place for 3 years. The implant must be taken out after 3 years.  Shots given every 3 months.  Pills taken every day.  Patches that are changed once a week.  A ring put into the vagina (vaginal ring). The ring is left in place for 3 weeks and removed for 1 week. Then, a new ring is put in the vagina.  Emergency birth control pills taken after unprotected sex (intercourse). BARRIER BIRTH CONTROL   A thin covering worn on the penis (female condom) during sex.  A soft, loose covering put into the vagina (female condom) before sex.  A rubber bowl that sits over the cervix (diaphragm). The bowl must be made for you. The bowl is put into the vagina before sex. The bowl is left in place for 6 to 8 hours after sex.  A small, soft cup that fits over the cervix (cervical cap). The cup must be made for you. The cup can be left in place for 48 hours after sex.  A sponge that is put into the vagina before sex.  A chemical that kills or stops sperm from getting into the cervix and uterus (spermicide). The chemical may be a cream, jelly, foam, or pill. INTRAUTERINE (IUD) BIRTH CONTROL   IUD birth control is a small, T-shaped piece of plastic. The plastic is put inside the uterus. There are 2 types of IUD:  Copper IUD. The IUD is covered in copper wire. The copper makes a fluid that kills sperm. It can stay in place for 10 years.  Hormone IUD. The hormone stops pregnancy from happening. It can  stay in place for 5 years. PERMANENT METHODS  When the woman has her fallopian tubes sealed, tied, or blocked during surgery. This stops the egg from traveling to the uterus.  The doctor places a small coil or insert into each fallopian tube. This causes scar tissue to form and blocks the fallopian tubes.  When the female has the tubes that carry sperm tied off (vasectomy). NATURAL FAMILY PLANNING BIRTH CONTROL   Natural family planning means not having sex or using barrier birth control on the days the woman could become pregnant.  Use a calendar to keep track of the length of each period and know the days she can get pregnant.  Avoid sex during ovulation.  Use a thermometer to measure body temperature. Also watch for symptoms of ovulation.  Time sex to be after the woman has ovulated. Use condoms to help protect yourself against sexually transmitted infections (STIs).  Do this no matter what type of birth control you use. Talk to your doctor about which type of birth control is best for you. Document Released: 01/03/2009 Document Revised: 11/08/2012 Document Reviewed: 09/27/2012 [facility] Patient Information 2014 [place].

## 2013-05-29 ENCOUNTER — Ambulatory Visit: Payer: Self-pay

## 2013-05-29 NOTE — Lactation Note (Signed)
This note was copied from the chart of Erica [name] Schrum. Lactation Consultation Note  Patient Name: Erica [name] [place] RUEAV'WToday's Date: 05/29/2013 Reason for consult: Follow-up assessment;Difficult latch RN changed Mom's nipple shield to size 16. Baby is nursing well with nipple shield, cannot latch without nipple shield. Colostrum present with nursing, Mom denies any discomfort with nursing. Mom's breasts are not full but are starting to fill. Mom reports feeling some changes. Engorgement care reviewed if needed. Advised Mom not to miss any feeding. Continue to monitor for breast milk in the nipple shield, monitor void/stools. [facility] get DEBP. Encouraged Mom to post pump during the day to encourage milk production and give baby back any amount of EBM she receives.  OP follow up scheduled for Thursday, 06/07/13 at 4:00 pm. Advised of support group.   Maternal Data    Feeding Feeding Type: Breast Fed Length of feed: 15 min  LATCH Score/Interventions Latch: Grasps breast easily, tongue down, lips flanged, rhythmical sucking. (using #16 nipple shield) Intervention(s): Assist with latch  Audible Swallowing: Spontaneous and intermittent  Type of Nipple: Everted at rest and after stimulation (short nipple shaft) Intervention(s): Hand pump;Shells  Comfort (Breast/Nipple): Filling, red/small blisters or bruises, mild/mod discomfort     Hold (Positioning): Assistance needed to correctly position infant at breast and maintain latch. Intervention(s): Breastfeeding basics reviewed;Support Pillows;Position options;Skin to skin  LATCH Score: 8  Lactation Tools Discussed/Used Tools: Nipple Dorris CarnesShields;Shells Nipple shield size: 20 Shell Type: Inverted Breast pump type: Manual WIC Program: Yes   Consult Status Consult Status: Complete Date: 05/29/13 Follow-up type: In-patient    Alfred LevinsGranger, Virda Betters Ann 05/29/2013, 9:57 AM

## 2013-05-30 NOTE — Progress Notes (Signed)
Post discharge chart review completed.  

## 2013-06-07 ENCOUNTER — Ambulatory Visit (HOSPITAL_COMMUNITY)
Admission: RE | Admit: 2013-06-07 | Discharge: 2013-06-07 | Disposition: A | Discharge status: Home / Self Care | Payer: Medicaid Other | Source: Ambulatory Visit | Attending: Obstetrics & Gynecology | Admitting: Obstetrics & Gynecology

## 2013-06-07 NOTE — Lactation Note (Signed)
Infant Lactation Consultation Outpatient Visit Note  Patient Name: Erica [name] Date of Birth: 1995/12/05   Gestational Age at Delivery: Gestational Age: <None> Type of Delivery: Vaginal delivery  BW - 7-6 oz  D/C weight - 7-0 oz  1st Dr. Ethelene Hal oz  3/19 7-15 oz  Today's weight -  7-15.4 oz  Reason for consult - F/U due to use of nipple shield  Breastfeeding History- per mom no nipple soreness or engorgement . Just breast feeding with #16 Nipple shield.  Baby awake and alert , moist mucous membranes, noted a bright red  diaper rash , no elevation , mom presently using desitin  Diaper cream ( per mom Dr. Marolyn Haller to continue present tx ). Per grandma the rash  has only been there a few days   Frequency of Breastfeeding: every 2-3 hours  Length of Feeding: 15- 20 mins both breast  Voids: 5-6  Stools: 5-6 yellowish brown stools   Supplementing / Method: per mom no supplementing just breast feeding  Pumping:  Type of Pump:DEBP Latina from [facility]    Frequency: x1 in the last week   Volume:  70 ml   Comments:using #24 flange with comfort   Consultation Evaluation: Both breast heavy and full , no engorgement noted , just areas of full milk ducts. Right breast upper lateral and lateral aspect,  Baby has good mobility of tongue forward and side ways and able to stretch over gum line. Noted after feeding both sides and re-latching without the nipple  Shield baby was sucking upper lip and latch became difficult. 3 out of the 4 latches at consult were without the nipple shield.  LC re-checked sizing of the Nipple shield #16 ,  Noted the #16 did not pull much of the areola into the nipple shield , changed to #20 Nipple shield , noted to be a much better fit and when baby latched , per mom comfortable.   Last feeding prior to consult - @230p  for 15 mins both breast per mom with nipple shield  Initial Feeding Assessment: Pre-feed Weight:7-15.4 oz 3612 g  Post-feed Weight:8-0.8 oz 3652 g   Amount Transferred: 40 ml  Comments: used #20 Nipple shield, mom independent with latch, depth obtained and baby fed for 15 mins transferring 40 ml, using the cross cradle position  Additional Feeding Assessment: Pre-feed Weight: 8-0.8 oz 3652 g  Post-feed Weight: 8-1.4 oz 3668 g  Amount Transferred: 16 ml  Comments:Showed mom how to re-latch in football position without the nipple shield . Baby obtained depth and fed for another 10 mins with multiply swallows ,  increased with breast compressions and per mom comfortable. Lips stayed flanged and nipple appeared normal when baby released. Nodules in breast resolved.  Discussed with mom the importance of rotating positions at least between 2 to prevent plugged ducts.   Additional Feeding Assessment: Pre-feed Weight: 8-1.4 oz 3668g  Post-feed Weight: 8-1.4 oz 3670 g  Amount Transferred: 2 ml  Comments:Latched on left breast in cross cradle position for for a few sucks and released with hiccups.   Additional Feeding Assessment : Pre-feeding weight - 8-1.4 oz 3670g Post- weight- 8-1.7 oz 3676 g Amount transferred : 6 ml  Comments - latched and sucked for a few minutes and released and fell asleep Total Breast milk Transferred this Visit: 64 ml  Total Supplement Given: none   Summary - Discussed with mom working on weaning baby from nipple shield and that it might take time. If still having  to use a nipple shield weekly weight checks would be indicated. If the baby didn't feed on the 2nd breast not to allow that breast to become engorged. Use the Latina pump on that breast for 7-10 mins if needed.   Lactation Plan of Care - Praised mom for her great efforts breast feeding , and grandma for her Bf support                                          - Continue to feed every 2-3 hours and with feeding cues.                                         - Reviewed steps for latching                                          - Feed 1st breast until  softened , checking for any nodules ( massage )                                          - If Kian  feeds one breast , release the other with hand expressing or pumping 7-10 mins                                          - Use #20 NS    Follow-Up- LC recommended attending the [facility] on Monday evenings for weight check and support 7 pm or Tuesday at 11am                    - per mom F/U Monday April 9th for 1 month check up @[facility]       Kathrin Greathouseorio, Rosaura Bolon Ann 06/07/2013, 4:42 PM

## 2014-01-21 ENCOUNTER — Encounter (HOSPITAL_COMMUNITY): Payer: Self-pay | Admitting: *Deleted

## 2014-04-23 ENCOUNTER — Encounter (HOSPITAL_COMMUNITY): Payer: Self-pay | Admitting: *Deleted

## 2014-04-23 ENCOUNTER — Inpatient Hospital Stay (HOSPITAL_COMMUNITY)
Admission: AD | Admit: 2014-04-23 | Discharge: 2014-04-23 | Disposition: A | Discharge status: Home / Self Care | Payer: Medicaid Other | Source: Ambulatory Visit | Attending: Family Medicine | Admitting: Family Medicine

## 2014-04-23 DIAGNOSIS — A499 Bacterial infection, unspecified: Secondary | ICD-10-CM

## 2014-04-23 DIAGNOSIS — Z30431 Encounter for routine checking of intrauterine contraceptive device: Secondary | ICD-10-CM | POA: Insufficient documentation

## 2014-04-23 DIAGNOSIS — N760 Acute vaginitis: Secondary | ICD-10-CM

## 2014-04-23 DIAGNOSIS — B9689 Other specified bacterial agents as the cause of diseases classified elsewhere: Secondary | ICD-10-CM | POA: Diagnosis not present

## 2014-04-23 LAB — POCT PREGNANCY, URINE: Preg Test, Ur: NEGATIVE

## 2014-04-23 LAB — URINALYSIS, ROUTINE W REFLEX MICROSCOPIC
Bilirubin Urine: NEGATIVE
Glucose, UA: NEGATIVE mg/dL
HGB URINE DIPSTICK: NEGATIVE
KETONES UR: NEGATIVE mg/dL
Leukocytes, UA: NEGATIVE
Nitrite: NEGATIVE
Protein, ur: NEGATIVE mg/dL
Specific Gravity, Urine: >1.03 — ABNORMAL HIGH (ref 1.005–1.030)
UROBILINOGEN UA: 0.2 mg/dL (ref 0.0–1.0)
pH: 6 (ref 5.0–8.0)

## 2014-04-23 LAB — WET PREP, GENITAL
Trich, Wet Prep: NONE SEEN
Yeast Wet Prep HPF POC: NONE SEEN

## 2014-04-23 MED ORDER — METRONIDAZOLE 500 MG PO TABS: 500.0000 mg | ORAL_TABLET | Freq: Two times a day (BID) | ORAL | Status: DC

## 2014-04-23 NOTE — MAU Note (Signed)
Had some bleeding a Couple wks ago when using the restroom. Light spotting until a couple days ago.  Can't feel strings of mirena, used to be able to feel them

## 2014-04-23 NOTE — MAU Note (Addendum)
PT SAYS  SHE DEL VAG 05-27-2013-  BY  HD.   MIRENA INSERTED  BY HD.   LAST TIME  FELT  STRING  WAS 2-3  WEEKS  AGO.    NO OTHER  BC.  LAST SEX-   3 MTHS  AGO.    NO CYCLE  SINCE ON MIRENA .   HAD  RED SPOTTING  2 WEEKS AGO-  NONE NOW.

## 2014-04-23 NOTE — MAU Provider Note (Signed)
CC: mirena placement checked       HPI Erica [name] is a 19 y.o. G1P1001 who presents because she was unable to feel her Mirena strings on self-check. Has been feeling it without difficulty before now. [facility]. Last intercourse 4 months ago.   Past Medical History  Diagnosis Date  . Medical history non-contributory     OB History  Gravida Para Term Preterm AB SAB TAB Ectopic Multiple Living  1 1 1       1     # Outcome Date GA Lbr Len/2nd Weight Sex Delivery Anes PTL Lv  1 Term 05/27/13 [id number] 17:11 / 07:13 3.365 kg (7 lb 6.7 oz) M Vag-Vacuum EPI  Y      Past Surgical History  Procedure Laterality Date  . Eye surgery      History   Social History  . Marital Status: Single    Spouse Name: N/A    Number of Children: N/A  . Years of Education: N/A   Occupational History  . Not on file.   Social History Main Topics  . Smoking status: Never Smoker   . Smokeless tobacco: Never Used  . Alcohol Use: No  . Drug Use: No  . Sexual Activity: Yes    Birth Control/ Protection: None   Other Topics Concern  . Not on file   Social History Narrative    No current facility-administered medications on file prior to encounter.   Current Outpatient Prescriptions on File Prior to Encounter  Medication Sig Dispense Refill  . Prenatal Vit-Fe Fumarate-FA (PRENATAL MULTIVITAMIN) TABS tablet Take 1 tablet by mouth daily at 12 noon.    Marland Kitchen. ibuprofen (ADVIL,MOTRIN) 100 MG/5ML suspension Take 30 mLs (600 mg total) by mouth every 6 (six) hours. (Patient not taking: Reported on 04/23/2014) 60 mL 0    No Known Allergies   ROS Denies irritative vaginal discharge, abnormal bleeding, pelvic pain, dysparunia. No new sex partner.    PHYSICAL EXAM Filed Vitals:   04/23/14 1722  BP: 114/66  Pulse: 77  Temp:   Resp: 20   General: Well nourished, well developed female in no acute distress Cardiovascular: Normal rate Respiratory: Normal effort Abdomen: Soft,  nontender Back: No CVAT Extremities: No edema Neurologic:grossly intact Speculum exam: NEFG; vagina with copius leukorrhea, no blood; cervix clean and Mirena strings visible but were coiled Bimanual exam: cervix closed, no CMT; uterus NSSP; no adnexal tenderness or masses   LAB RESULTS Results for orders placed or performed during the hospital encounter of 04/23/14 (from the past 24 hour(s))  Urinalysis, Routine w reflex microscopic     Status: Abnormal   Collection Time: 04/23/14  5:02 PM  Result Value Ref Range   Color, Urine YELLOW YELLOW   APPearance CLEAR CLEAR   Specific Gravity, Urine >1.030 (H) 1.005 - 1.030   pH 6.0 5.0 - 8.0   Glucose, UA NEGATIVE NEGATIVE mg/dL   Hgb urine dipstick NEGATIVE NEGATIVE   Bilirubin Urine NEGATIVE NEGATIVE   Ketones, ur NEGATIVE NEGATIVE mg/dL   Protein, ur NEGATIVE NEGATIVE mg/dL   Urobilinogen, UA 0.2 0.0 - 1.0 mg/dL   Nitrite NEGATIVE NEGATIVE   Leukocytes, UA NEGATIVE NEGATIVE  Pregnancy, urine POC     Status: None   Collection Time: 04/23/14  5:10 PM  Result Value Ref Range   Preg Test, Ur NEGATIVE NEGATIVE  Wet prep, genital     Status: Abnormal   Collection Time: 04/23/14  5:41 PM  Result Value Ref Range   Yeast Wet Prep HPF POC NONE SEEN NONE SEEN   Trich, Wet Prep NONE SEEN NONE SEEN   Clue Cells Wet Prep HPF POC MANY (A) NONE SEEN   WBC, Wet Prep HPF POC MANY (A) NONE SEEN    IMAGING No results found.  MAU COURSE GC, CT sent   ASSESSMENT  1. BV (bacterial vaginosis)     PLAN Discharge home. See AVS for patient education.    Medication List    STOP taking these medications        ibuprofen 100 MG/5ML suspension  Commonly known as:  ADVIL,MOTRIN      TAKE these medications        levonorgestrel 20 MCG/24HR IUD  Commonly known as:  MIRENA  1 each by Intrauterine route once. Administered 07/13/13.     metroNIDAZOLE 500 MG tablet  Commonly known as:  FLAGYL  Take 1 tablet (500 mg total) by mouth 2 (two)  times daily.     prenatal multivitamin Tabs tablet  Take 1 tablet by mouth daily at 12 noon.       Follow-up Information    Follow up with [facility] [place]. Schedule an appointment as soon as possible for a visit in 6 months.   Contact information:   [address] [facility] [address] [phone]        Danae Orleans, CNM 04/23/2014 5:36 PM

## 2014-04-23 NOTE — Discharge Instructions (Signed)
Levonorgestrel intrauterine device (IUD) What is this medicine? LEVONORGESTREL IUD (LEE voe nor jes trel) is a contraceptive (birth control) device. The device is placed inside the uterus by a healthcare professional. It is used to prevent pregnancy and can also be used to treat heavy bleeding that occurs during your period. Depending on the device, it can be used for 3 to 5 years. This medicine may be used for other purposes; ask your health care provider or pharmacist if you have questions. COMMON BRAND NAME(S): Verda Cumins What should I tell my health care provider before I take this medicine? They need to know if you have any of these conditions: -abnormal Pap smear -cancer of the breast, uterus, or cervix -diabetes -endometritis -genital or pelvic infection now or in the past -have more than one sexual partner or your partner has more than one partner -heart disease -history of an ectopic or tubal pregnancy -immune system problems -IUD in place -liver disease or tumor -problems with blood clots or take blood-thinners -use intravenous drugs -uterus of unusual shape -vaginal bleeding that has not been explained -an unusual or allergic reaction to levonorgestrel, other hormones, silicone, or polyethylene, medicines, foods, dyes, or preservatives -pregnant or trying to get pregnant -breast-feeding How should I use this medicine? This device is placed inside the uterus by a health care professional. Talk to your pediatrician regarding the use of this medicine in children. Special care may be needed. Overdosage: If you think you have taken too much of this medicine contact a poison control center or emergency room at once. NOTE: This medicine is only for you. Do not share this medicine with others. What if I miss a dose? This does not apply. What may interact with this medicine? Do not take this medicine with any of the following  medications: -amprenavir -bosentan -fosamprenavir This medicine may also interact with the following medications: -aprepitant -barbiturate medicines for inducing sleep or treating seizures -bexarotene -griseofulvin -medicines to treat seizures like carbamazepine, ethotoin, felbamate, oxcarbazepine, phenytoin, topiramate -modafinil -pioglitazone -rifabutin -rifampin -rifapentine -some medicines to treat HIV infection like atazanavir, indinavir, lopinavir, nelfinavir, tipranavir, ritonavir -St. John's wort -warfarin This list may not describe all possible interactions. Give your health care provider a list of all the medicines, herbs, non-prescription drugs, or dietary supplements you use. Also tell them if you smoke, drink alcohol, or use illegal drugs. Some items may interact with your medicine. What should I watch for while using this medicine? Visit your doctor or health care professional for regular check ups. See your doctor if you or your partner has sexual contact with others, becomes HIV positive, or gets a sexual transmitted disease. This product does not protect you against HIV infection (AIDS) or other sexually transmitted diseases. You can check the placement of the IUD yourself by reaching up to the top of your vagina with clean fingers to feel the threads. Do not pull on the threads. It is a good habit to check placement after each menstrual period. Call your doctor right away if you feel more of the IUD than just the threads or if you cannot feel the threads at all. The IUD may come out by itself. You may become pregnant if the device comes out. If you notice that the IUD has come out use a backup birth control method like condoms and call your health care provider. Using tampons will not change the position of the IUD and are okay to use during your period. What side effects may  I notice from receiving this medicine? Side effects that you should report to your doctor or  health care professional as soon as possible: -allergic reactions like skin rash, itching or hives, swelling of the face, lips, or tongue -fever, flu-like symptoms -genital sores -high blood pressure -no menstrual period for 6 weeks during use -pain, swelling, warmth in the leg -pelvic pain or tenderness -severe or sudden headache -signs of pregnancy -stomach cramping -sudden shortness of breath -trouble with balance, talking, or walking -unusual vaginal bleeding, discharge -yellowing of the eyes or skin Side effects that usually do not require medical attention (report to your doctor or health care professional if they continue or are bothersome): -acne -breast pain -change in sex drive or performance -changes in weight -cramping, dizziness, or faintness while the device is being inserted -headache -irregular menstrual bleeding within first 3 to 6 months of use -nausea This list may not describe all possible side effects. Call your doctor for medical advice about side effects. You may report side effects to FDA at 1-800-FDA-1088. Where should I keep my medicine? This does not apply. NOTE: This sheet is a summary. It may not cover all possible information. If you have questions about this medicine, talk to your doctor, pharmacist, or health care provider.  2015, Elsevier/Gold Standard. (2011-04-08 13:54:04)  Bacterial Vaginosis Bacterial vaginosis is a vaginal infection that occurs when the normal balance of bacteria in the vagina is disrupted. It results from an overgrowth of certain bacteria. This is the most common vaginal infection in women of childbearing age. Treatment is important to prevent complications, especially in pregnant women, as it can cause a premature delivery. CAUSES  Bacterial vaginosis is caused by an increase in harmful bacteria that are normally present in smaller amounts in the vagina. Several different kinds of bacteria can cause bacterial vaginosis.  However, the reason that the condition develops is not fully understood. RISK FACTORS Certain activities or behaviors can put you at an increased risk of developing bacterial vaginosis, including:  Having a new sex partner or multiple sex partners.  Douching.  Using an intrauterine device (IUD) for contraception. Women do not get bacterial vaginosis from toilet seats, bedding, swimming pools, or contact with objects around them. SIGNS AND SYMPTOMS  Some women with bacterial vaginosis have no signs or symptoms. Common symptoms include:  Grey vaginal discharge.  A fishlike odor with discharge, especially after sexual intercourse.  Itching or burning of the vagina and vulva.  Burning or pain with urination. DIAGNOSIS  Your health care provider will take a medical history and examine the vagina for signs of bacterial vaginosis. A sample of vaginal fluid may be taken. Your health care provider will look at this sample under a microscope to check for bacteria and abnormal cells. A vaginal pH test may also be done.  TREATMENT  Bacterial vaginosis may be treated with antibiotic medicines. These may be given in the form of a pill or a vaginal cream. A second round of antibiotics may be prescribed if the condition comes back after treatment.  HOME CARE INSTRUCTIONS   Only take over-the-counter or prescription medicines as directed by your health care provider.  If antibiotic medicine was prescribed, take it as directed. Make sure you finish it even if you start to feel better.  Do not have sex until treatment is completed.  Tell all sexual partners that you have a vaginal infection. They should see their health care provider and be treated if they have problems, such as  a mild rash or itching.  Practice safe sex by using condoms and only having one sex partner. SEEK MEDICAL CARE IF:   Your symptoms are not improving after 3 days of treatment.  You have increased discharge or pain.  You  have a fever. MAKE SURE YOU:   Understand these instructions.  Will watch your condition.  Will get help right away if you are not doing well or get worse. FOR MORE INFORMATION  Centers for Disease Control and Prevention, Division of STD Prevention: [employer]Apps[employer].za American Sexual Health Association (ASHA): www.ashastd.org  Document Released: 03/08/2005 Document Revised: 12/27/2012 Document Reviewed: 10/18/2012 [facility] Patient Information 2015 [place], [place]. This information is not intended to replace advice given to you by your health care provider. Make sure you discuss any questions you have with your health care provider.

## 2014-04-24 LAB — GC/CHLAMYDIA PROBE AMP (~~LOC~~) NOT AT ARMC
Chlamydia: NEGATIVE
NEISSERIA GONORRHEA: NEGATIVE

## 2014-06-05 ENCOUNTER — Emergency Department (HOSPITAL_COMMUNITY)
Admission: EM | Admit: 2014-06-05 | Discharge: 2014-06-05 | Disposition: A | Discharge status: Home / Self Care | Payer: Medicaid Other | Attending: Emergency Medicine | Admitting: Emergency Medicine

## 2014-06-05 ENCOUNTER — Encounter (HOSPITAL_COMMUNITY): Payer: Self-pay | Admitting: Family Medicine

## 2014-06-05 ENCOUNTER — Emergency Department (HOSPITAL_COMMUNITY): Payer: Medicaid Other

## 2014-06-05 DIAGNOSIS — R058 Other specified cough: Secondary | ICD-10-CM

## 2014-06-05 DIAGNOSIS — J069 Acute upper respiratory infection, unspecified: Secondary | ICD-10-CM | POA: Diagnosis not present

## 2014-06-05 DIAGNOSIS — Z793 Long term (current) use of hormonal contraceptives: Secondary | ICD-10-CM | POA: Diagnosis not present

## 2014-06-05 DIAGNOSIS — J45901 Unspecified asthma with (acute) exacerbation: Secondary | ICD-10-CM | POA: Insufficient documentation

## 2014-06-05 DIAGNOSIS — J3489 Other specified disorders of nose and nasal sinuses: Secondary | ICD-10-CM | POA: Diagnosis not present

## 2014-06-05 DIAGNOSIS — Z79899 Other long term (current) drug therapy: Secondary | ICD-10-CM | POA: Diagnosis not present

## 2014-06-05 DIAGNOSIS — R05 Cough: Secondary | ICD-10-CM

## 2014-06-05 DIAGNOSIS — R059 Cough, unspecified: Secondary | ICD-10-CM

## 2014-06-05 DIAGNOSIS — Z792 Long term (current) use of antibiotics: Secondary | ICD-10-CM | POA: Insufficient documentation

## 2014-06-05 DIAGNOSIS — H9203 Otalgia, bilateral: Secondary | ICD-10-CM | POA: Diagnosis not present

## 2014-06-05 DIAGNOSIS — R0981 Nasal congestion: Secondary | ICD-10-CM

## 2014-06-05 IMAGING — DX DG CHEST 2V
2 series · 2 of 2 positions shown · non-contrast
Comparison: None.

CLINICAL DATA: Productive cough.  Cold symptoms.

EXAM:
CHEST - 2 VIEW

[chest pa]
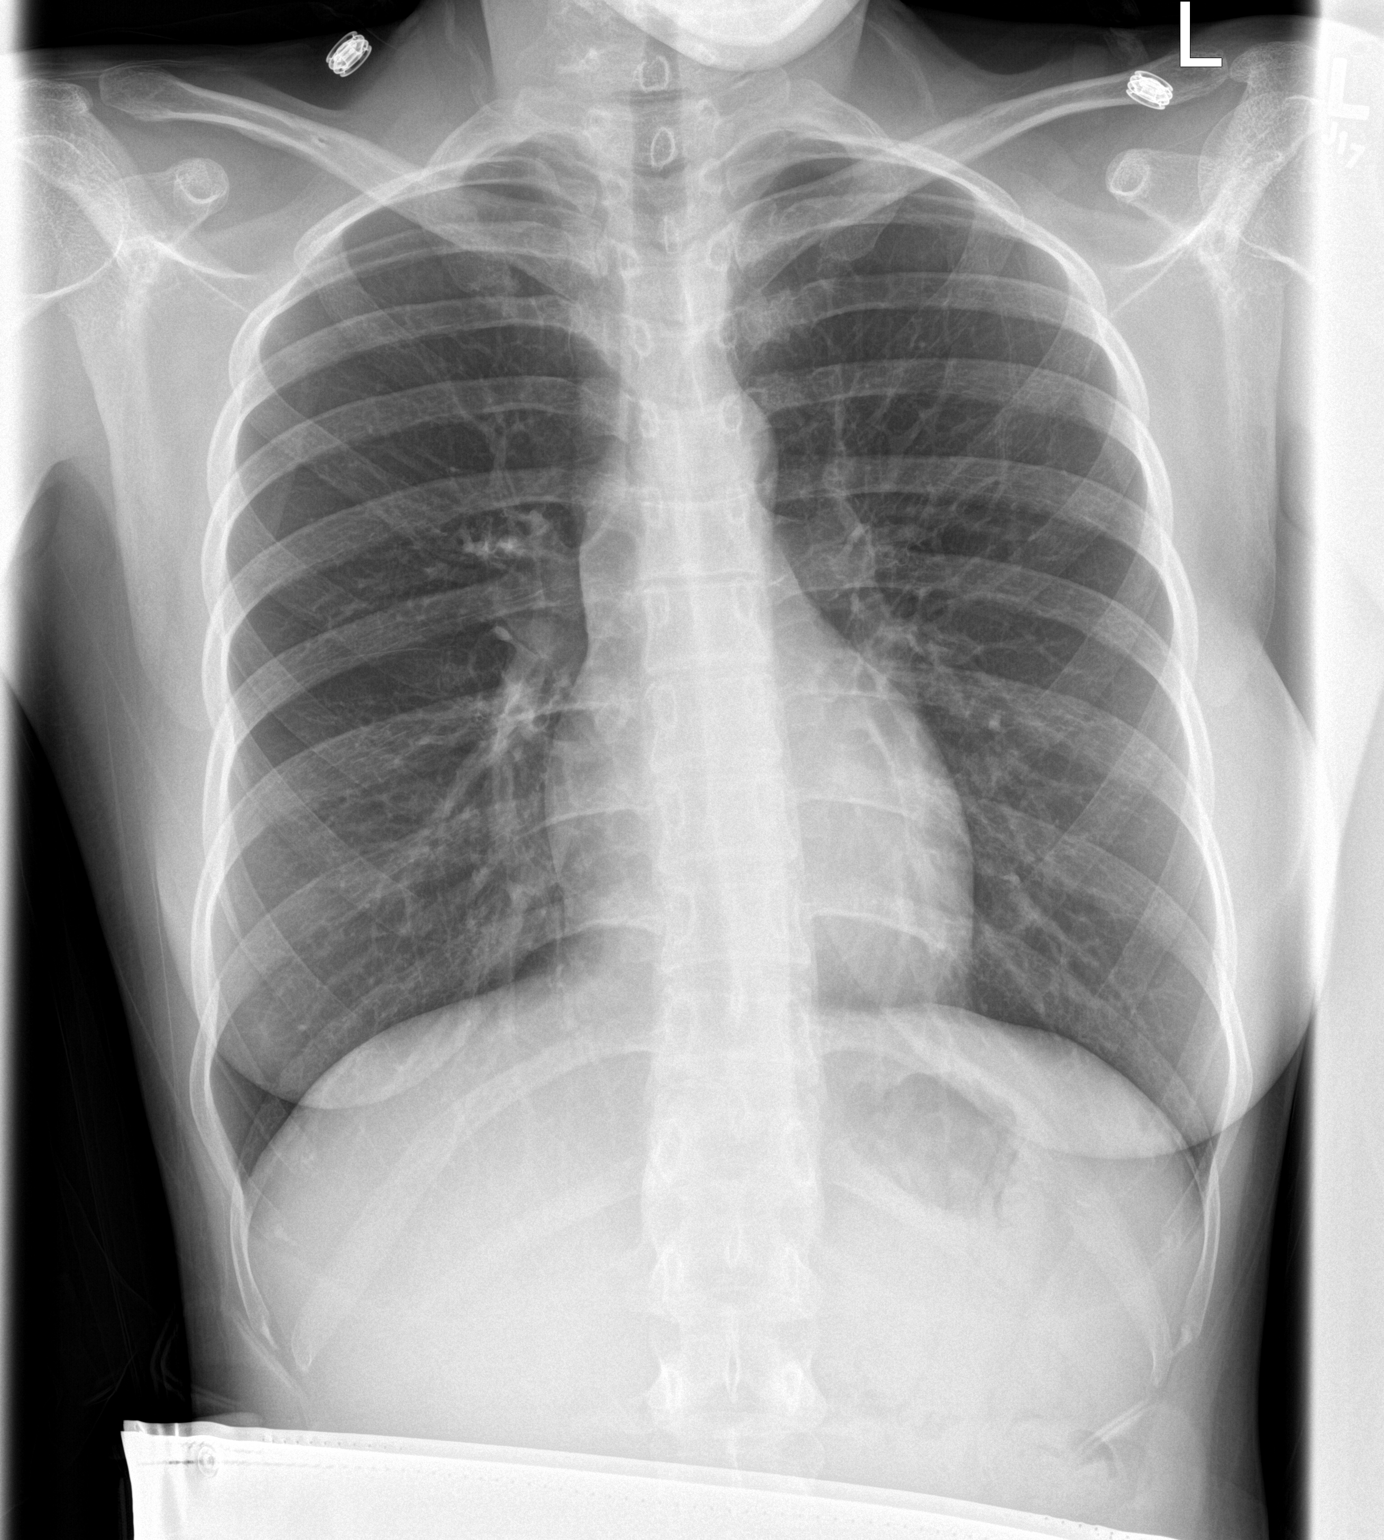

[chest lat]
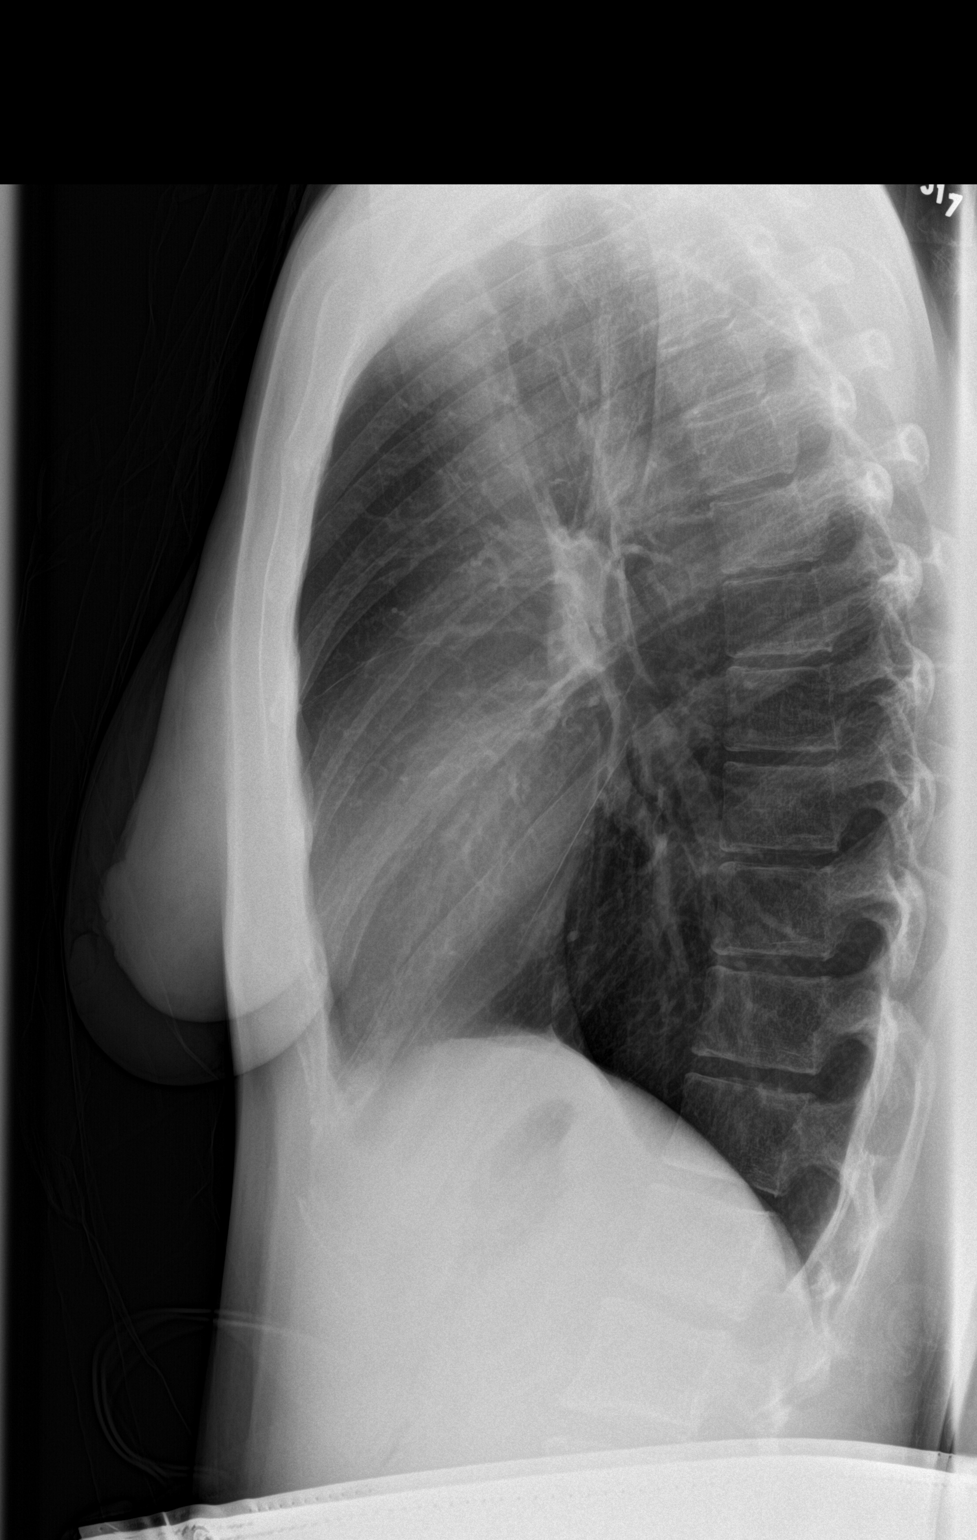

[2 of 2 positions shown; findings below may reference images not displayed]

FINDINGS: The heart size and mediastinal contours are within normal limits.
Both lungs are clear. The visualized skeletal structures are
unremarkable.
IMPRESSION: No active disease.

## 2014-06-05 MED ORDER — PREDNISONE 20 MG PO TABS
60.0000 mg | ORAL_TABLET | Freq: Once | ORAL | Status: AC
  Administered 2014-06-05: 60 mg via ORAL
  Filled 2014-06-05: qty 3

## 2014-06-05 MED ORDER — PREDNISONE 20 MG PO TABS: ORAL_TABLET | ORAL | Status: DC

## 2014-06-05 MED ORDER — ALBUTEROL SULFATE HFA 108 (90 BASE) MCG/ACT IN AERS
2.0000 | INHALATION_SPRAY | Freq: Once | RESPIRATORY_TRACT | Status: AC
  Administered 2014-06-05: 2 via RESPIRATORY_TRACT
  Filled 2014-06-05: qty 6.7

## 2014-06-05 MED ORDER — AZITHROMYCIN 250 MG PO TABS: ORAL_TABLET | ORAL | Status: DC

## 2014-06-05 MED ORDER — ALBUTEROL SULFATE HFA 108 (90 BASE) MCG/ACT IN AERS: 2.0000 | INHALATION_SPRAY | RESPIRATORY_TRACT | Status: DC | PRN

## 2014-06-05 MED ORDER — FLUTICASONE PROPIONATE 50 MCG/ACT NA SUSP: 2.0000 | Freq: Every day | NASAL | Status: DC

## 2014-06-05 NOTE — ED Notes (Signed)
Pt states she has had cough/yellow sputum, onset 2 weeks ago. Denies sore throat but states she has right ear pain.

## 2014-06-05 NOTE — ED Provider Notes (Signed)
CSN: [id number]     Arrival date & time 06/05/14  1122 History   First MD Initiated Contact with Patient 06/05/14 1255     Chief Complaint  Patient presents with  . Cough     (Consider location/radiation/quality/duration/timing/severity/associated sxs/prior Treatment) HPI Comments: Erica [name] is a 19 y.o. female with a PMHx of asthma (although not noted in chart), who presents to the ED with complaints of cough with yellowish sputum production 2 weeks, bilateral ear pressure, and clearish yellow rhinorrhea 2 weeks. +Sick contacts at home. Overall with no improvement of symptoms, but not worsening over the last 2 weeks. She states her b/l ear pain is 5/10 popping/pressure-like, nonradiating, intermittent, worse with blowing her nose, with no medications tried prior to arrival for any of her symptoms. She reports a history of asthma but she has not on any inhalers stating that her asthma is "not that bad". She denies any fevers, chills, sore throat, ear drainage, I A chain/discharge/redness, wheezing, chest pain, shortness breath, abdominal pain, nausea, vomiting, diarrhea, dysuria, hematuria, vaginal wittier discharge, numbness, tingling, weakness, or rashes. No known environmental allergies. Currently breastfeeding.  Patient is a 19 y.o. female presenting with cough. The history is provided by the patient. No language interpreter was used.  Cough Cough characteristics:  Productive Sputum characteristics:  Yellow Onset quality:  Gradual Duration:  2 weeks Timing:  Constant Progression:  Unchanged Chronicity:  New Smoker: no   Context: sick contacts   Relieved by:  None tried Worsened by:  Nothing tried Ineffective treatments:  None tried Associated symptoms: ear fullness (pressure), ear pain (b/l pressure), rhinorrhea and sinus congestion   Associated symptoms: no chest pain, no chills, no eye discharge, no fever, no myalgias, no rash, no shortness of breath, no sore throat and no  wheezing     Past Medical History  Diagnosis Date  . Medical history non-contributory    Past Surgical History  Procedure Laterality Date  . Eye surgery     History reviewed. No pertinent family history. History  Substance Use Topics  . Smoking status: Never Smoker   . Smokeless tobacco: Never Used  . Alcohol Use: No   OB History    Gravida Para Term Preterm AB TAB SAB Ectopic Multiple Living   Review of Systems  Constitutional: Negative for fever and chills.  HENT: Positive for congestion, ear pain (b/l pressure) and rhinorrhea. Negative for ear discharge and sore throat.   Eyes: Negative for pain, discharge, redness and itching.  Respiratory: Positive for cough. Negative for shortness of breath and wheezing.   Cardiovascular: Negative for chest pain.  Gastrointestinal: Negative for nausea, vomiting, abdominal pain and diarrhea.  Genitourinary: Negative for dysuria, hematuria, vaginal bleeding and vaginal discharge.  Musculoskeletal: Negative for myalgias and arthralgias.  Skin: Negative for rash.  Allergic/Immunologic: Negative for environmental allergies and immunocompromised state.  Neurological: Negative for weakness and numbness.  Psychiatric/Behavioral: Negative for confusion.   10 Systems reviewed and are negative for acute change except as noted in the HPI.    Allergies  Review of patient's allergies indicates no known allergies.  Home Medications   Prior to Admission medications   Medication Sig Start Date End Date Taking? Authorizing Provider  levonorgestrel (MIRENA) 20 MCG/24HR IUD 1 each by Intrauterine route once. Administered 07/13/13.    Historical Provider, MD  metroNIDAZOLE (FLAGYL) 500 MG tablet Take 1 tablet (500 mg total) by mouth 2 (  two) times daily. 04/23/14   Deirdre Colin Mulders Poe, CNM  Prenatal Vit-Fe Fumarate-FA (PRENATAL MULTIVITAMIN) TABS tablet Take 1 tablet by mouth daily at 12 noon.    Historical Provider, MD   BP 122/83 mmHg   Pulse 91  Temp(Src) 98.2 F (36.8 C)  Resp 18  SpO2 96% Physical Exam  Constitutional: She is oriented to person, place, and time. Vital signs are normal. She appears well-developed and well-nourished.  Non-toxic appearance. No distress.  Afebrile, nontoxic, NAD  HENT:  Head: Normocephalic and atraumatic.  Right Ear: Hearing, tympanic membrane, external ear and ear canal normal.  Left Ear: Hearing, tympanic membrane, external ear and ear canal normal.  Nose: Mucosal edema and rhinorrhea present.  Mouth/Throat: Uvula is midline, oropharynx is clear and moist and mucous membranes are normal. No trismus in the jaw. No uvula swelling.  Mucosal edema and clear rhinorrhea Ears clear bilaterally Oropharynx clear and moist without tonsillar exudates or erythema  Eyes: Conjunctivae and EOM are normal. Right eye exhibits no discharge. Left eye exhibits no discharge.  Neck: Normal range of motion. Neck supple.  Cardiovascular: Normal rate, regular rhythm, S1 normal, S2 normal, normal heart sounds, intact distal pulses and normal pulses.  Exam reveals no gallop and no friction rub.   No murmur heard. Pulmonary/Chest: Effort normal and breath sounds normal. No respiratory distress. She has no decreased breath sounds. She has no wheezes. She has no rhonchi. She has no rales.  CTAB in all lung fields, no w/r/r, no hypoxia or increased WOB, speaking in full sentences, SpO2 96% on RA  Intermittent productive cough  Abdominal: Normal appearance. She exhibits no distension.  Musculoskeletal: Normal range of motion.  MAE x4 Strength and sensation grossly intact Distal pulses intact  Lymphadenopathy:    She has cervical adenopathy.  Shotty anterior cervical LAD bilaterally, nonTTP  Neurological: She is alert and oriented to person, place, and time. She has normal strength. No sensory deficit.  Skin: Skin is warm, dry and intact. No rash noted.  Psychiatric: She has a normal mood and affect. Her behavior  is normal.  Nursing note and vitals reviewed.   ED Course  Procedures (including critical care time) Labs Review Labs Reviewed - No data to display  Imaging Review Dg Chest 2 View  06/05/2014   CLINICAL DATA:  Productive cough.  Cold symptoms.  EXAM: CHEST - 2 VIEW  COMPARISON:  None.  FINDINGS: The heart size and mediastinal contours are within normal limits. Both lungs are clear. The visualized skeletal structures are unremarkable.  IMPRESSION: No active disease.   Electronically Signed   By: Marin Robertshristopher  Mattern M.D.   On: 06/05/2014 13:03     EKG Interpretation None      MDM   Final diagnoses:  Cough with sputum  Asthma exacerbation  URI (upper respiratory infection)  Sinus congestion    18 y.o. female  Here with cough x2 wks, also has nasal congestion, rhinorrhea, and b/l ear pressure. States she has a hx of asthma, although this isn't listed in her chart. +Sick contacts. Overall not improving, although denies worsening of symptoms. Given that she's had symptoms x2wks, antibiotics may be indicated at this point although afebrile here. Clear lung exam but given hx of asthma, will give inhaler here and prednisone, and send home with prednisone. Will await CXR results then reassess.  1:15 PM CXR clear. Given her hx of asthma, and duration of current symptoms, agreed to give her a safety script. If no improvement in  3 days, or worsening of symptoms with fevers occurs, will have her start these abx. Will send home with inhaler and prednisone for possible asthma exacerbation, although no wheezing today. Could still be viral illness. Will have her f/up with PCP in 3-5 days for recheck. Will also give flonase and instruct on symptomatic care. I explained the diagnosis and have given explicit precautions to return to the ER including for any other new or worsening symptoms. The patient understands and accepts the medical plan as it's been dictated and I have answered their questions.  Discharge instructions concerning home care and prescriptions have been given. The patient is STABLE and is discharged to home in good condition.  BP 122/83 mmHg  Pulse 91  Temp(Src) 98.2 F (36.8 C)  Resp 18  SpO2 96%  Meds ordered this encounter  Medications  . albuterol (PROVENTIL HFA;VENTOLIN HFA) 108 (90 BASE) MCG/ACT inhaler 2 puff    Sig:   . predniSONE (DELTASONE) tablet 60 mg    Sig:   . albuterol (PROVENTIL HFA;VENTOLIN HFA) 108 (90 BASE) MCG/ACT inhaler    Sig: Inhale 2 puffs into the lungs every 2 (two) hours as needed for wheezing or shortness of breath (cough).    Dispense:  1 Inhaler    Refill:  0    Order Specific Question:  Supervising Provider    Answer:  MILLER, BRIAN [[id number]]  . azithromycin (ZITHROMAX Z-PAK) 250 MG tablet    Sig: 2 po day one, then 1 daily x 4 days. ONLY START IF NO IMPROVEMENT IN 3 DAYS OR IF SYMPTOMS WORSEN    Dispense:  5 tablet    Refill:  0    Order Specific Question:  Supervising Provider    Answer:  MILLER, BRIAN [[id number]]  . predniSONE (DELTASONE) 20 MG tablet    Sig: 3 tabs po daily x 3 days    Dispense:  9 tablet    Refill:  0    Order Specific Question:  Supervising Provider    Answer:  MILLER, BRIAN [[id number]]  . fluticasone (FLONASE) 50 MCG/ACT nasal spray    Sig: Place 2 sprays into both nostrils daily.    Dispense:  16 g    Refill:  0    Order Specific Question:  Supervising Provider    Answer:  Eusebio Friendly     Cu[place] Attar Camprubi-Soms, PA-C 06/05/14 1321  Gilda Crease, MD 06/05/14 1418

## 2014-06-05 NOTE — Discharge Instructions (Signed)
Continue to stay well-hydrated and well rested. Continue to use Tylenol for pain or fever. Use Mucinex for cough suppression/expectoration of mucus. Use netipot and flonase to help with nasal congestion. May consider over-the-counter Benadryl or other antihistamine to decrease secretions and for watery itchy eyes. Take prednisone and use inhaler as directed to help with possible asthma exacerbation. Start taking antibiotic only if your symptoms worsen (including development of fever), or your symptoms persist as they are in 3 days. If your symptoms improve, do NOT start this antibiotic. Followup with your primary care doctor in 3-5 days for recheck of ongoing symptoms. Return to emergency department for emergent changing or worsening of symptoms.   Cough, Adult  A cough is a reflex that helps clear your throat and airways. It can help heal the body or may be a reaction to an irritated airway. A cough may only last 2 or 3 weeks (acute) or may last more than 8 weeks (chronic).  CAUSES Acute cough:  Viral or bacterial infections. Chronic cough:  Infections.  Allergies.  Asthma.  Post-nasal drip.  Smoking.  Heartburn or acid reflux.  Some medicines.  Chronic lung problems (COPD).  Cancer. SYMPTOMS   Cough.  Fever.  Chest pain.  Increased breathing rate.  High-pitched whistling sound when breathing (wheezing).  Colored mucus that you cough up (sputum). TREATMENT   A bacterial cough may be treated with antibiotic medicine.  A viral cough must run its course and will not respond to antibiotics.  Your caregiver may recommend other treatments if you have a chronic cough. HOME CARE INSTRUCTIONS   Only take over-the-counter or prescription medicines for pain, discomfort, or fever as directed by your caregiver. Use cough suppressants only as directed by your caregiver.  Use a cold steam vaporizer or humidifier in your bedroom or home to help loosen secretions.  Sleep in a  semi-upright position if your cough is worse at night.  Rest as needed.  Stop smoking if you smoke. SEEK IMMEDIATE MEDICAL CARE IF:   You have pus in your sputum.  Your cough starts to worsen.  You cannot control your cough with suppressants and are losing sleep.  You begin coughing up blood.  You have difficulty breathing.  You develop pain which is getting worse or is uncontrolled with medicine.  You have a fever. MAKE SURE YOU:   Understand these instructions.  Will watch your condition.  Will get help right away if you are not doing well or get worse. Document Released: 09/04/2010 Document Revised: 05/31/2011 Document Reviewed: 09/04/2010 [facility] Patient Information 2015 [place], [place]. This information is not intended to replace advice given to you by your health care provider. Make sure you discuss any questions you have with your health care provider.  Asthma Asthma is a condition of the lungs in which the airways tighten and narrow. Asthma can make it hard to breathe. Asthma cannot be cured, but medicine and lifestyle changes can help control it. Asthma may be started (triggered) by:  Animal skin flakes (dander).  Dust.  Cockroaches.  Pollen.  Mold.  Smoke.  Cleaning products.  Hair sprays or aerosol sprays.  Paint fumes or strong smells.  Cold air, weather changes, and winds.  Crying or laughing hard.  Stress.  Certain medicines or drugs.  Foods, such as dried fruit, potato chips, and sparkling grape juice.  Infections or conditions (colds, flu).  Exercise.  Certain medical conditions or diseases.  Exercise or tiring activities. HOME CARE   Take medicine  as told by your doctor.  Use a peak flow meter as told by your doctor. A peak flow meter is a tool that measures how well the lungs are working.  Record and keep track of the peak flow meter's readings.  Understand and use the asthma action plan. An asthma action plan is a written  plan for taking care of your asthma and treating your attacks.  To help prevent asthma attacks:  Do not smoke. Stay away from secondhand smoke.  Change your heating and air conditioning filter often.  Limit your use of fireplaces and wood stoves.  Get rid of pests (such as roaches and mice) and their droppings.  Throw away plants if you see mold on them.  Clean your floors. Dust regularly. Use cleaning products that do not smell.  Have someone vacuum when you are not home. Use a vacuum cleaner with a HEPA filter if possible.  Replace carpet with wood, tile, or vinyl flooring. Carpet can trap animal skin flakes and dust.  Use allergy-proof pillows, mattress covers, and box spring covers.  Wash bed sheets and blankets every week in hot water and dry them in a dryer.  Use blankets that are made of polyester or cotton.  Clean bathrooms and kitchens with bleach. If possible, have someone repaint the walls in these rooms with mold-resistant paint. Keep out of the rooms that are being cleaned and painted.  Wash hands often. GET HELP IF:  You have make a whistling sound when breaking (wheeze), have shortness of breath, or have a cough even if taking medicine to prevent attacks.  The colored mucus you cough up (sputum) is thicker than usual.  The colored mucus you cough up changes from clear or white to yellow, green, gray, or bloody.  You have problems from the medicine you are taking such as:  A rash.  Itching.  Swelling.  Trouble breathing.  You need reliever medicines more than 2-3 times a week.  Your peak flow measurement is still at 50-79% of your personal best after following the action plan for 1 hour.  You have a fever. GET HELP RIGHT AWAY IF:   You seem to be worse and are not responding to medicine during an asthma attack.  You are short of breath even at rest.  You get short of breath when doing very little activity.  You have trouble eating, drinking,  or talking.  You have chest pain.  You have a fast heartbeat.  Your lips or fingernails start to turn blue.  You are light-headed, dizzy, or faint.  Your peak flow is less than 50% of your personal best. MAKE SURE YOU:   Understand these instructions.  Will watch your condition.  Will get help right away if you are not doing well or get worse. Document Released: 08/25/2007 Document Revised: 07/23/2013 Document Reviewed: 10/05/2012 [facility] Patient Information 2015 [place], [place]. This information is not intended to replace advice given to you by your health care provider. Make sure you discuss any questions you have with your health care provider.  Cool Mist Vaporizers Vaporizers may help relieve the symptoms of a cough and cold. They add moisture to the air, which helps mucus to become thinner and less sticky. This makes it easier to breathe and cough up secretions. Cool mist vaporizers do not cause serious burns like hot mist vaporizers, which may also be called steamers or humidifiers. Vaporizers have not been proven to help with colds. You should not use a vaporizer  if you are allergic to mold. HOME CARE INSTRUCTIONS  Follow the package instructions for the vaporizer.  Do not use anything other than distilled water in the vaporizer.  Do not run the vaporizer all of the time. This can cause mold or bacteria to grow in the vaporizer.  Clean the vaporizer after each time it is used.  Clean and dry the vaporizer well before storing it.  Stop using the vaporizer if worsening respiratory symptoms develop. Document Released: 12/04/2003 Document Revised: 03/13/2013 Document Reviewed: 07/26/2012 [facility] Patient Information 2015 [place], [place]. This information is not intended to replace advice given to you by your health care provider. Make sure you discuss any questions you have with your health care provider.  Upper Respiratory Infection, Adult An upper respiratory infection  (URI) is also sometimes known as the common cold. The upper respiratory tract includes the nose, sinuses, throat, trachea, and bronchi. Bronchi are the airways leading to the lungs. Most people improve within 1 week, but symptoms can last up to 2 weeks. A residual cough may last even longer.  CAUSES Many different viruses can infect the tissues lining the upper respiratory tract. The tissues become irritated and inflamed and often become very moist. Mucus production is also common. A cold is contagious. You can easily spread the virus to others by oral contact. This includes kissing, sharing a glass, coughing, or sneezing. Touching your mouth or nose and then touching a surface, which is then touched by another person, can also spread the virus. SYMPTOMS  Symptoms typically develop 1 to 3 days after you come in contact with a cold virus. Symptoms vary from person to person. They may include:  Runny nose.  Sneezing.  Nasal congestion.  Sinus irritation.  Sore throat.  Loss of voice (laryngitis).  Cough.  Fatigue.  Muscle aches.  Loss of appetite.  Headache.  Low-grade fever. DIAGNOSIS  You might diagnose your own cold based on familiar symptoms, since most people get a cold 2 to 3 times a year. Your caregiver can confirm this based on your exam. Most importantly, your caregiver can check that your symptoms are not due to another disease such as strep throat, sinusitis, pneumonia, asthma, or epiglottitis. Blood tests, throat tests, and X-rays are not necessary to diagnose a common cold, but they may sometimes be helpful in excluding other more serious diseases. Your caregiver will decide if any further tests are required. RISKS AND COMPLICATIONS  You may be at risk for a more severe case of the common cold if you smoke cigarettes, have chronic heart disease (such as heart failure) or lung disease (such as asthma), or if you have a weakened immune system. The very young and very old are  also at risk for more serious infections. Bacterial sinusitis, middle ear infections, and bacterial pneumonia can complicate the common cold. The common cold can worsen asthma and chronic obstructive pulmonary disease (COPD). Sometimes, these complications can require emergency medical care and may be life-threatening. PREVENTION  The best way to protect against getting a cold is to practice good hygiene. Avoid oral or hand contact with people with cold symptoms. Wash your hands often if contact occurs. There is no clear evidence that vitamin C, vitamin E, echinacea, or exercise reduces the chance of developing a cold. However, it is always recommended to get plenty of rest and practice good nutrition. TREATMENT  Treatment is directed at relieving symptoms. There is no cure. Antibiotics are not effective, because the infection is caused by  a virus, not by bacteria. Treatment may include:  Increased fluid intake. Sports drinks offer valuable electrolytes, sugars, and fluids.  Breathing heated mist or steam (vaporizer or shower).  Eating chicken soup or other clear broths, and maintaining good nutrition.  Getting plenty of rest.  Using gargles or lozenges for comfort.  Controlling fevers with ibuprofen or acetaminophen as directed by your caregiver.  Increasing usage of your inhaler if you have asthma. Zinc gel and zinc lozenges, taken in the first 24 hours of the common cold, can shorten the duration and lessen the severity of symptoms. Pain medicines may help with fever, muscle aches, and throat pain. A variety of non-prescription medicines are available to treat congestion and runny nose. Your caregiver can make recommendations and may suggest nasal or lung inhalers for other symptoms.  HOME CARE INSTRUCTIONS   Only take over-the-counter or prescription medicines for pain, discomfort, or fever as directed by your caregiver.  Use a warm mist humidifier or inhale steam from a shower to  increase air moisture. This may keep secretions moist and make it easier to breathe.  Drink enough water and fluids to keep your urine clear or pale yellow.  Rest as needed.  Return to work when your temperature has returned to normal or as your caregiver advises. You may need to stay home longer to avoid infecting others. You can also use a face mask and careful hand washing to prevent spread of the virus. SEEK MEDICAL CARE IF:   After the first few days, you feel you are getting worse rather than better.  You need your caregiver's advice about medicines to control symptoms.  You develop chills, worsening shortness of breath, or brown or red sputum. These may be signs of pneumonia.  You develop yellow or brown nasal discharge or pain in the face, especially when you bend forward. These may be signs of sinusitis.  You develop a fever, swollen neck glands, pain with swallowing, or white areas in the back of your throat. These may be signs of strep throat. SEEK IMMEDIATE MEDICAL CARE IF:   You have a fever.  You develop severe or persistent headache, ear pain, sinus pain, or chest pain.  You develop wheezing, a prolonged cough, cough up blood, or have a change in your usual mucus (if you have chronic lung disease).  You develop sore muscles or a stiff neck. Document Released: 09/01/2000 Document Revised: 05/31/2011 Document Reviewed: 06/13/2013 [facility] Patient Information 2015 [place], [place]. This information is not intended to replace advice given to you by your health care provider. Make sure you discuss any questions you have with your health care provider.

## 2014-06-05 NOTE — ED Notes (Signed)
Pt here for cough, congestion x 2 weeks.

## 2014-06-10 ENCOUNTER — Telehealth: Payer: Self-pay | Admitting: *Deleted

## 2014-06-10 NOTE — Telephone Encounter (Signed)
Pt called to office this morning asking for return call.  Attempt to contact pt.  LM on VM to contact office.

## 2014-06-11 ENCOUNTER — Telehealth: Payer: Self-pay | Admitting: Obstetrics

## 2014-06-11 NOTE — Telephone Encounter (Signed)
[id number] - Patient called - scheduled NP appt per Lynnae SandhoffSusanne for 07/12/2014  . brm

## 2014-07-09 ENCOUNTER — Telehealth: Payer: Self-pay | Admitting: *Deleted

## 2014-07-09 NOTE — Telephone Encounter (Signed)
No reason on VM for call

## 2014-07-12 ENCOUNTER — Encounter: Payer: Self-pay | Admitting: Certified Nurse Midwife

## 2014-07-12 ENCOUNTER — Ambulatory Visit (INDEPENDENT_AMBULATORY_CARE_PROVIDER_SITE_OTHER): Payer: Medicaid Other | Admitting: Certified Nurse Midwife

## 2014-07-12 VITALS — BP 110/72 | HR 78 | Temp 98.2°F | Wt 107.8 lb

## 2014-07-12 DIAGNOSIS — L739 Follicular disorder, unspecified: Secondary | ICD-10-CM

## 2014-07-12 DIAGNOSIS — Z789 Other specified health status: Secondary | ICD-10-CM

## 2014-07-12 DIAGNOSIS — Z0000 Encounter for general adult medical examination without abnormal findings: Secondary | ICD-10-CM | POA: Diagnosis not present

## 2014-07-12 DIAGNOSIS — Z01419 Encounter for gynecological examination (general) (routine) without abnormal findings: Secondary | ICD-10-CM

## 2014-07-12 LAB — CBC
HEMATOCRIT: 46.4 % — AB (ref 36.0–46.0)
Hemoglobin: 15.1 g/dL — ABNORMAL HIGH (ref 12.0–15.0)
MCH: 31.9 pg (ref 26.0–34.0)
MCHC: 32.5 g/dL (ref 30.0–36.0)
MCV: 97.9 fL (ref 78.0–100.0)
MPV: 9.9 fL (ref 8.6–12.4)
Platelets: 244 10*3/uL (ref 150–400)
RBC: 4.74 MIL/uL (ref 3.87–5.11)
RDW: 13.5 % (ref 11.5–15.5)
WBC: 6.5 10*3/uL (ref 4.0–10.5)

## 2014-07-12 MED ORDER — CITRANATAL HARMONY 30-1-260 MG PO CAPS: 1.0000 | ORAL_CAPSULE | Freq: Every day | ORAL | Status: DC

## 2014-07-12 MED ORDER — HYDROCORTISONE 1 % EX CREA: TOPICAL_CREAM | CUTANEOUS | Status: AC

## 2014-07-12 MED ORDER — TRIPLE ANTIBIOTIC 5-400-5000 EX OINT: TOPICAL_OINTMENT | Freq: Four times a day (QID) | CUTANEOUS | Status: DC

## 2014-07-12 NOTE — Telephone Encounter (Signed)
Pt has been seen in office

## 2014-07-12 NOTE — Progress Notes (Signed)
Patient ID: Erica SimpsonShamara T [name], female   DOB: 12-27-1995, 19 y.o.   MRN: [id number]   Subjective:      Erica [name] is a 19 y.o. female here for a routine exam.  Current complaints:  None.  Delivered a female infant 05/2013.  Currently breastfeeding, eating solids.   Had Mirena [facility]th, 2015 at the [facility].  Denies any problems with the Mirena IUD, checks for the strings.  Denies any unprotected intercourse.  Infrequent spotting with IUD, every 3 months lasts about a week.   ?BCA paternal aunt, deceased dx in her 2440's.  Employed.      Personal health questionnaire:  Is patient Ashkenazi Jewish, have a family history of breast and/or ovarian cancer: yes Is there a family history of uterine cancer diagnosed at age < 1350, gastrointestinal cancer, urinary tract cancer, family member who is a Personnel officerLynch syndrome-associated carrier: no Is the patient overweight and hypertensive, family history of diabetes, personal history of gestational diabetes, preeclampsia or PCOS: no Is patient over 3055, have PCOS,  family history of premature CHD under age 21, diabetes, smoke, have hypertension or peripheral artery disease:  no At any time, has a partner hit, kicked or otherwise hurt or frightened you?: no Over the past 2 weeks, have you felt down, depressed or hopeless?: no Over the past 2 weeks, have you felt little interest or pleasure in doing things?:no   Gynecologic History No LMP recorded. Patient is not currently having periods (Reason: IUD). Contraception: IUD Last Pap: N/A.  Last mammogram: N/A.   Obstetric History OB History  Gravida Para Term Preterm AB SAB TAB Ectopic Multiple Living  1 1 1       1     # Outcome Date GA Lbr Len/2nd Weight Sex Delivery Anes PTL Lv  1 Term 05/27/13 [id number] 17:11 / 07:13 3.365 kg (7 lb 6.7 oz) M Vag-Vacuum EPI  Y      Past Medical History  Diagnosis Date  . Medical history non-contributory     Past Surgical History  Procedure Laterality Date  . Eye  surgery       Current outpatient prescriptions:  .  albuterol (PROVENTIL HFA;VENTOLIN HFA) 108 (90 BASE) MCG/ACT inhaler, Inhale 2 puffs into the lungs every 2 (two) hours as needed for wheezing or shortness of breath (cough)., Disp: 1 Inhaler, Rfl: 0 .  fluticasone (FLONASE) 50 MCG/ACT nasal spray, Place 2 sprays into both nostrils daily., Disp: 16 g, Rfl: 0 .  levonorgestrel (MIRENA) 20 MCG/24HR IUD, 1 each by Intrauterine route once. Administered 07/13/13., Disp: , Rfl:  .  hydrocortisone cream 1 %, Apply to affected area 2 times daily, Disp: 30 g, Rfl: 1 .  neomycin-bacitracin-polymyxin (NEOSPORIN) [phone] ointment, Apply topically 4 (four) times daily., Disp: 28.3 g, Rfl: 0 .  Prenat w/o A-FeCbn-DSS-FA-DHA (CITRANATAL HARMONY) 30-1-260 MG CAPS, Take 1 capsule by mouth daily., Disp: 30 capsule, Rfl: 12 No Known Allergies  History  Substance Use Topics  . Smoking status: Never Smoker   . Smokeless tobacco: Never Used  . Alcohol Use: No    History reviewed. No pertinent family history.    Review of Systems  Constitutional: negative for fatigue and weight loss Respiratory: negative for cough and wheezing Cardiovascular: negative for chest pain, fatigue and palpitations Gastrointestinal: negative for abdominal pain and change in bowel habits Musculoskeletal:negative for myalgias Neurological: negative for gait problems and tremors Behavioral/Psych: negative for abusive relationship, depression Endocrine: negative for temperature intolerance   Genitourinary:negative for abnormal  menstrual periods, genital lesions, hot flashes, sexual problems and vaginal discharge Integument/breast: negative for breast lump, breast tenderness, nipple discharge and skin lesion(s)    Objective:       BP 110/72 mmHg  Pulse 78  Temp(Src) 98.2 F (36.8 C)  Wt 48.898 kg (107 lb 12.8 oz) General:   alert  Skin:   no rash or abnormalities  Lungs:   clear to auscultation bilaterally  Heart:    regular rate and rhythm, S1, S2 normal, no murmur, click, rub or gallop  Breasts:   normal without suspicious masses, skin or nipple changes or axillary nodes  Abdomen:  normal findings: no organomegaly, soft, non-tender and no hernia  Pelvis:  External genitalia: normal general appearance Urinary system: urethral meatus normal and bladder without fullness, nontender Vaginal: normal without tenderness, induration or masses Cervix: normal appearance, + IUD strings Adnexa: normal bimanual exam Uterus: anteverted and non-tender, normal size   Lab Review Urine pregnancy test Labs reviewed yes Radiologic studies reviewed no  50% of 30 min visit spent on counseling and coordination of care.   Assessment:    Healthy female exam.   IUD strings present Folliculitis in groin d/t shaving   Plan:    Education reviewed: depression evaluation, low fat, low cholesterol diet, safe sex/STD prevention, self breast exams, skin cancer screening and weight bearing exercise. Contraception: IUD.     Meds ordered this encounter  Medications  . neomycin-bacitracin-polymyxin (NEOSPORIN) [phone] ointment    Sig: Apply topically 4 (four) times daily.    Dispense:  28.3 g    Refill:  0  . hydrocortisone cream 1 %    Sig: Apply to affected area 2 times daily    Dispense:  30 g    Refill:  1  . DISCONTD: Prenat w/o A-FeCbn-DSS-FA-DHA (CITRANATAL HARMONY) 30-1-260 MG CAPS    Sig: Take 1 capsule by mouth daily.    Dispense:  30 capsule    Refill:  12  . Prenat w/o A-FeCbn-DSS-FA-DHA (CITRANATAL HARMONY) 30-1-260 MG CAPS    Sig: Take 1 capsule by mouth daily.    Dispense:  30 capsule    Refill:  12   Orders Placed This Encounter  Procedures  . SureSwab, Vaginosis/Vaginitis Plus  . HIV antibody (with reflex)  . Hepatitis B surface antigen  . RPR  . Hepatitis C antibody  . CBC  . Comprehensive metabolic panel  . TSH   Need to obtain previous records from [facility]. Follow up as needed or  with annual exam next year.

## 2014-07-13 LAB — HEPATITIS B SURFACE ANTIGEN: Hepatitis B Surface Ag: NEGATIVE

## 2014-07-13 LAB — COMPREHENSIVE METABOLIC PANEL
ALBUMIN: 3.9 g/dL (ref 3.5–5.2)
ALK PHOS: 56 U/L (ref 39–117)
ALT: 12 U/L (ref 0–35)
AST: 15 U/L (ref 0–37)
BILIRUBIN TOTAL: 0.9 mg/dL (ref 0.2–1.2)
BUN: 8 mg/dL (ref 6–23)
CO2: 22 meq/L (ref 19–32)
CREATININE: 0.6 mg/dL (ref 0.50–1.10)
Calcium: 8.7 mg/dL (ref 8.4–10.5)
Chloride: 104 mEq/L (ref 96–112)
GLUCOSE: 73 mg/dL (ref 70–99)
Potassium: 3.8 mEq/L (ref 3.5–5.3)
Sodium: 142 mEq/L (ref 135–145)
TOTAL PROTEIN: 6.5 g/dL (ref 6.0–8.3)

## 2014-07-13 LAB — RPR

## 2014-07-13 LAB — TSH: TSH: 1.14 u[IU]/mL (ref 0.350–4.500)

## 2014-07-13 LAB — HEPATITIS C ANTIBODY: HCV Ab: NEGATIVE

## 2014-07-13 LAB — HIV ANTIBODY (ROUTINE TESTING W REFLEX): HIV 1&2 Ab, 4th Generation: NONREACTIVE

## 2014-07-16 ENCOUNTER — Other Ambulatory Visit: Payer: Self-pay | Admitting: Certified Nurse Midwife

## 2014-07-16 ENCOUNTER — Other Ambulatory Visit: Payer: Self-pay | Admitting: *Deleted

## 2014-07-16 DIAGNOSIS — B373 Candidiasis of vulva and vagina: Secondary | ICD-10-CM

## 2014-07-16 DIAGNOSIS — A749 Chlamydial infection, unspecified: Secondary | ICD-10-CM

## 2014-07-16 DIAGNOSIS — N760 Acute vaginitis: Principal | ICD-10-CM

## 2014-07-16 DIAGNOSIS — B9689 Other specified bacterial agents as the cause of diseases classified elsewhere: Secondary | ICD-10-CM

## 2014-07-16 DIAGNOSIS — B3731 Acute candidiasis of vulva and vagina: Secondary | ICD-10-CM

## 2014-07-16 LAB — SURESWAB, VAGINOSIS/VAGINITIS PLUS
Atopobium vaginae: NOT DETECTED Log (cells/mL)
BV CATEGORY: UNDETERMINED — AB
C. TRACHOMATIS RNA, TMA: DETECTED — AB
C. TROPICALIS, DNA: NOT DETECTED
C. albicans, DNA: DETECTED — AB
C. glabrata, DNA: NOT DETECTED
C. parapsilosis, DNA: NOT DETECTED
Gardnerella vaginalis: 6 Log (cells/mL)
LACTOBACILLUS SPECIES: >8 Log (cells/mL)
MEGASPHAERA SPECIES: NOT DETECTED Log (cells/mL)
N. gonorrhoeae RNA, TMA: NOT DETECTED
T. vaginalis RNA, QL TMA: NOT DETECTED

## 2014-07-16 MED ORDER — AZITHROMYCIN 500 MG PO TABS: 1000.0000 mg | ORAL_TABLET | Freq: Once | ORAL | Status: AC

## 2014-07-16 MED ORDER — AZITHROMYCIN 500 MG PO TABS: 500.0000 mg | ORAL_TABLET | Freq: Once | ORAL | Status: DC

## 2014-07-16 MED ORDER — TINIDAZOLE 500 MG PO TABS: 2.0000 g | ORAL_TABLET | Freq: Every day | ORAL | Status: DC

## 2014-07-16 MED ORDER — FLUCONAZOLE 100 MG PO TABS: 100.0000 mg | ORAL_TABLET | Freq: Every day | ORAL | Status: DC

## 2014-07-16 NOTE — Progress Notes (Signed)
Azithromycin was reordered due to error in original order.

## 2014-08-16 ENCOUNTER — Ambulatory Visit (INDEPENDENT_AMBULATORY_CARE_PROVIDER_SITE_OTHER): Payer: Medicaid Other | Admitting: Certified Nurse Midwife

## 2014-08-16 ENCOUNTER — Encounter: Payer: Self-pay | Admitting: Certified Nurse Midwife

## 2014-08-16 VITALS — BP 115/72 | HR 72 | Temp 98.4°F | Wt 107.0 lb

## 2014-08-16 DIAGNOSIS — Z8619 Personal history of other infectious and parasitic diseases: Secondary | ICD-10-CM | POA: Diagnosis not present

## 2014-08-16 DIAGNOSIS — A749 Chlamydial infection, unspecified: Secondary | ICD-10-CM

## 2014-08-16 NOTE — Progress Notes (Signed)
Patient ID: Erica [name], female   DOB: 1995/06/05, 19 y.o.   MRN: [id number]   Chief Complaint  Patient presents with  . Exposure to STD    toc for std    HPI Erica [name] is a 19 y.o. female.  Here for test of cure for recent exposure to Chlamydia.  IUD as method of contraception.  Unsure of LMP.  Not with last partner after STD exposure.  Encouraged safe sex practices.  Completed treatment and did not have sexual intercourse with the same partner after treatment.  She is not sure if he was treated.   Declines blood testing at this time for STIs.  HPI  Past Medical History  Diagnosis Date  . Medical history non-contributory     Past Surgical History  Procedure Laterality Date  . Eye surgery      No family history on file.  Social History History  Substance Use Topics  . Smoking status: Never Smoker   . Smokeless tobacco: Never Used  . Alcohol Use: No    No Known Allergies  Current Outpatient Prescriptions  Medication Sig Dispense Refill  . albuterol (PROVENTIL HFA;VENTOLIN HFA) 108 (90 BASE) MCG/ACT inhaler Inhale 2 puffs into the lungs every 2 (two) hours as needed for wheezing or shortness of breath (cough). 1 Inhaler 0  . fluconazole (DIFLUCAN) 100 MG tablet Take 1 tablet (100 mg total) by mouth daily. Repeat dose in 48-72 hours. (Patient not taking: Reported on 08/16/2014) 3 tablet 0  . fluticasone (FLONASE) 50 MCG/ACT nasal spray Place 2 sprays into both nostrils daily. (Patient not taking: Reported on 08/16/2014) 16 g 0  . hydrocortisone cream 1 % Apply to affected area 2 times daily (Patient not taking: Reported on 08/16/2014) 30 g 1  . levonorgestrel (MIRENA) 20 MCG/24HR IUD 1 each by Intrauterine route once. Administered 07/13/13.    Marland Kitchen neomycin-bacitracin-polymyxin (NEOSPORIN) [phone] ointment Apply topically 4 (four) times daily. (Patient not taking: Reported on 08/16/2014) 28.3 g 0  . Prenat w/o A-FeCbn-DSS-FA-DHA (CITRANATAL HARMONY) 30-1-260 MG CAPS  Take 1 capsule by mouth daily. (Patient not taking: Reported on 08/16/2014) 30 capsule 12  . tinidazole (TINDAMAX) 500 MG tablet Take 4 tablets (2,000 mg total) by mouth daily with breakfast. (Patient not taking: Reported on 08/16/2014) 12 tablet 0   No current facility-administered medications for this visit.    Review of Systems Review of Systems Constitutional: negative for fatigue and weight loss Respiratory: negative for cough and wheezing Cardiovascular: negative for chest pain, fatigue and palpitations Gastrointestinal: negative for abdominal pain and change in bowel habits Genitourinary:negative Integument/breast: negative for nipple discharge Musculoskeletal:negative for myalgias Neurological: negative for gait problems and tremors Behavioral/Psych: negative for abusive relationship, depression Endocrine: negative for temperature intolerance     Blood pressure 115/72, pulse 72, temperature 98.4 F (36.9 C), weight 107 lb (48.535 kg), currently breastfeeding.  Physical Exam Physical Exam General:   alert  Skin:   no rash or abnormalities  Lungs:   clear to auscultation bilaterally  Heart:   regular rate and rhythm, S1, S2 normal, no murmur, click, rub or gallop  Breasts:   deferred  Abdomen:  normal findings: no organomegaly, soft, non-tender and no hernia  Pelvis:  External genitalia: normal general appearance Urinary system: urethral meatus normal and bladder without fullness, nontender Vaginal: normal without tenderness, induration or masses Cervix: no CMT Adnexa: normal bimanual exam Uterus: anteverted and non-tender, normal size    75% of 15 min visit spent on counseling  and coordination of care.   Data Reviewed Previous medical hx, labs, meds  Assessment     TOC for recent Chlamydia exposure     Plan    Orders Placed This Encounter  Procedures  . SureSwab, Vaginosis/Vaginitis Plus   No orders of the defined types were placed in this encounter.      Follow up as needed or for annual exam in April 2017.

## 2014-08-20 LAB — SURESWAB, VAGINOSIS/VAGINITIS PLUS
Atopobium vaginae: NOT DETECTED Log (cells/mL)
C. TRACHOMATIS RNA, TMA: NOT DETECTED
C. TROPICALIS, DNA: NOT DETECTED
C. albicans, DNA: NOT DETECTED
C. glabrata, DNA: NOT DETECTED
C. parapsilosis, DNA: NOT DETECTED
GARDNERELLA VAGINALIS: 5.4 Log (cells/mL)
LACTOBACILLUS SPECIES: >8 Log (cells/mL)
MEGASPHAERA SPECIES: NOT DETECTED Log (cells/mL)
N. gonorrhoeae RNA, TMA: NOT DETECTED
T. VAGINALIS RNA, QL TMA: NOT DETECTED

## 2018-01-22 ENCOUNTER — Emergency Department (HOSPITAL_COMMUNITY)
Admission: EM | Admit: 2018-01-22 | Discharge: 2018-01-22 | Disposition: A | Discharge status: Home / Self Care | Payer: Medicaid Other | Attending: Emergency Medicine | Admitting: Emergency Medicine

## 2018-01-22 ENCOUNTER — Encounter (HOSPITAL_COMMUNITY): Payer: Self-pay | Admitting: Emergency Medicine

## 2018-01-22 DIAGNOSIS — B349 Viral infection, unspecified: Secondary | ICD-10-CM | POA: Diagnosis not present

## 2018-01-22 DIAGNOSIS — R1033 Periumbilical pain: Secondary | ICD-10-CM | POA: Diagnosis present

## 2018-01-22 DIAGNOSIS — Z79899 Other long term (current) drug therapy: Secondary | ICD-10-CM | POA: Diagnosis not present

## 2018-01-22 DIAGNOSIS — R0981 Nasal congestion: Secondary | ICD-10-CM | POA: Diagnosis not present

## 2018-01-22 LAB — CBC
HEMATOCRIT: 46.3 % — AB (ref 36.0–46.0)
Hemoglobin: 15.2 g/dL — ABNORMAL HIGH (ref 12.0–15.0)
MCH: 31.4 pg (ref 26.0–34.0)
MCHC: 32.8 g/dL (ref 30.0–36.0)
MCV: 95.7 fL (ref 80.0–100.0)
NRBC: 0 % (ref 0.0–0.2)
PLATELETS: 255 10*3/uL (ref 150–400)
RBC: 4.84 MIL/uL (ref 3.87–5.11)
RDW: 11.6 % (ref 11.5–15.5)
WBC: 9.2 10*3/uL (ref 4.0–10.5)

## 2018-01-22 LAB — COMPREHENSIVE METABOLIC PANEL
ALT: 14 U/L (ref 0–44)
AST: 17 U/L (ref 15–41)
Albumin: 3.7 g/dL (ref 3.5–5.0)
Alkaline Phosphatase: 31 U/L — ABNORMAL LOW (ref 38–126)
Anion gap: 6 (ref 5–15)
BUN: 5 mg/dL — ABNORMAL LOW (ref 6–20)
CHLORIDE: 110 mmol/L (ref 98–111)
CO2: 25 mmol/L (ref 22–32)
CREATININE: 0.75 mg/dL (ref 0.44–1.00)
Calcium: 8.6 mg/dL — ABNORMAL LOW (ref 8.9–10.3)
GFR calc Af Amer: >60 mL/min (ref >60)
Glucose, Bld: 93 mg/dL (ref 70–99)
POTASSIUM: 3.5 mmol/L (ref 3.5–5.1)
Sodium: 141 mmol/L (ref 135–145)
Total Bilirubin: 1.1 mg/dL (ref 0.3–1.2)
Total Protein: 6.8 g/dL (ref 6.5–8.1)

## 2018-01-22 LAB — URINALYSIS, ROUTINE W REFLEX MICROSCOPIC
BACTERIA UA: NONE SEEN
BILIRUBIN URINE: NEGATIVE
Glucose, UA: NEGATIVE mg/dL
Ketones, ur: NEGATIVE mg/dL
Leukocytes, UA: NEGATIVE
NITRITE: NEGATIVE
PH: 5 (ref 5.0–8.0)
Protein, ur: NEGATIVE mg/dL
SPECIFIC GRAVITY, URINE: 1.025 (ref 1.005–1.030)

## 2018-01-22 LAB — I-STAT BETA HCG BLOOD, ED (MC, WL, AP ONLY): I-stat hCG, quantitative: <5 m[IU]/mL (ref <5)

## 2018-01-22 NOTE — ED Provider Notes (Signed)
[facility] EMERGENCY DEPARTMENT Provider Note   CSN: [id number] Arrival date & time: 01/22/18  1300     History   Chief Complaint Chief Complaint  Patient presents with  . Abdominal Pain  . Fever    HPI Erica [name] is a 22 y.o. female here for evaluation of periumbilical abdominal pain, described as mild, like gas pains, intermittent, non radiating x 1 week.  No alleviating or aggravating factors. No interventions. Associated with chills, runny nose, nasal congestion, scratchy throat.  She wonders if she has the flu. No sick contacts.  Denies fevers, cough, nausea, vomiting, diarrhea, constipation, dysuria, hematuria, urgency, frequency.    HPI  Past Medical History:  Diagnosis Date  . Medical history non-contributory     Patient Active Problem List   Diagnosis Date Noted  . Chlamydial infection 08/16/2014    Past Surgical History:  Procedure Laterality Date  . EYE SURGERY       OB History    Gravida  1   Para  1   Term  1   Preterm      AB      Living  1     SAB      TAB      Ectopic      Multiple      Live Births  1            Home Medications    Prior to Admission medications   Medication Sig Start Date End Date Taking? Authorizing Provider  albuterol (PROVENTIL HFA;VENTOLIN HFA) 108 (90 BASE) MCG/ACT inhaler Inhale 2 puffs into the lungs every 2 (two) hours as needed for wheezing or shortness of breath (cough). 06/05/14   Street, Mercedes, PA-C  fluconazole (DIFLUCAN) 100 MG tablet Take 1 tablet (100 mg total) by mouth daily. Repeat dose in 48-72 hours. Patient not taking: Reported on 08/16/2014 07/16/14   Orvilla Cornwall A, CNM  fluticasone (FLONASE) 50 MCG/ACT nasal spray Place 2 sprays into both nostrils daily. Patient not taking: Reported on 08/16/2014 06/05/14   Street, [place], PA-C  levonorgestrel (MIRENA) 20 MCG/24HR IUD 1 each by Intrauterine route once. Administered 07/13/13.    [provider]    neomycin-bacitracin-polymyxin (NEOSPORIN) [phone] ointment Apply topically 4 (four) times daily. Patient not taking: Reported on 08/16/2014 07/12/14   Orvilla Cornwall A, CNM  Prenat w/o A-FeCbn-DSS-FA-DHA (CITRANATAL HARMONY) 30-1-260 MG CAPS Take 1 capsule by mouth daily. Patient not taking: Reported on 08/16/2014 07/12/14   Roe Coombs, CNM  tinidazole (TINDAMAX) 500 MG tablet Take 4 tablets (2,000 mg total) by mouth daily with breakfast. Patient not taking: Reported on 08/16/2014 07/16/14   Roe Coombs, CNM    Family History No family history on file.  Social History Social History   Tobacco Use  . Smoking status: Never Smoker  . Smokeless tobacco: Never Used  Substance Use Topics  . Alcohol use: No    Alcohol/week: 0.0 standard drinks  . Drug use: No     Allergies   Patient has no known allergies.   Review of Systems Review of Systems  Constitutional: Positive for chills.  HENT: Positive for congestion, postnasal drip and sore throat.   Gastrointestinal: Positive for abdominal pain.  All other systems reviewed and are negative.    Physical Exam Updated Vital Signs BP 113/76 (BP Location: Right Arm)   Pulse 75   Temp 98.3 F (36.8 C) (Oral)   Resp 18   LMP 01/04/2018 (Approximate)  SpO2 100%   Physical Exam  Constitutional: She is oriented to person, place, and time. She appears well-developed and well-nourished.  Non toxic  HENT:  Head: Normocephalic and atraumatic.  Nose: Nose normal.  Marked mucosal edema, erythema, rhinorrhea bilaterally. L>R. Oropharynx and tonsils slightly erythematous, but no exudates, petechiae. Uvula midline. Normal tongue protrusion, no SL fullness. Normal protrusion of tongue. Normal phonation. No trismus. No pooling of oral secretions. TMs normal.   Eyes: Pupils are equal, round, and reactive to light. Conjunctivae and EOM are normal.  Neck: Normal range of motion.  Cardiovascular: Normal rate and regular rhythm.   Pulmonary/Chest: Effort normal and breath sounds normal.  Abdominal: Soft. Bowel sounds are normal. There is no tenderness.  No G/R/R. No suprapubic or CVA tenderness. Negative Murphy's and McBurney's. Active BS to lower quadrants.   Musculoskeletal: Normal range of motion.  Neurological: She is alert and oriented to person, place, and time.  Skin: Skin is warm and dry. Capillary refill takes less than 2 seconds.  Psychiatric: She has a normal mood and affect. Her behavior is normal.  Nursing note and vitals reviewed.    ED Treatments / Results  Labs (all labs ordered are listed, but only abnormal results are displayed) Labs Reviewed  COMPREHENSIVE METABOLIC PANEL - Abnormal; Notable for the following components:      Result Value   BUN 5 (*)    Calcium 8.6 (*)    Alkaline Phosphatase 31 (*)    All other components within normal limits  CBC - Abnormal; Notable for the following components:   Hemoglobin 15.2 (*)    HCT 46.3 (*)    All other components within normal limits  URINALYSIS, ROUTINE W REFLEX MICROSCOPIC - Abnormal; Notable for the following components:   APPearance HAZY (*)    Hgb urine dipstick SMALL (*)    All other components within normal limits  I-STAT BETA HCG BLOOD, ED ([facility], [facility], [facility] ONLY)    EKG None  Radiology No results found.  Procedures Procedures (including critical care time)  Medications Ordered in ED Medications - No data to display   Initial Impression / Assessment and Plan / ED Course  I have reviewed the triage vital signs and the nursing notes.  Pertinent labs & imaging results that were available during my care of the patient were reviewed by me and considered in my medical decision making (see chart for details).     Likely viral syndrome. She has marked mucosal edema and slight oropharyngeal/tonsillar erythema without significant edema, exudates. She describes "scratchy throat", intermittent and no pain.  centor score is low.  Labs  obtained at triage reviewed and WNL.  Doubt PNA, strep pharyngitis, or acute intraabdominal process given reassuring exam, labs and lack of concerning features such as fevers, cough, n/v/d/c. Her abd was non tender today.  Will dc with symptomatic tx. Return precautions given. Pt is in agreement. Requested work note, given.   Final Clinical Impressions(s) / ED Diagnoses   Final diagnoses:  Viral illness  Nasal congestion    ED Discharge Orders    None       Liberty Handy, PA-C 01/22/18 1504    Pricilla Loveless, MD 01/27/18 [phone]

## 2018-01-22 NOTE — ED Notes (Signed)
Pt states mid abd pain x   1 week pt arrives from waiting room eating goldfish and drinking water , denies vag d/c or dysuria

## 2018-01-22 NOTE — ED Triage Notes (Signed)
Pt reports lower abd pain x1 week, also started having fevers, body aches  and chills last night. Denies sore throat but endorses cough. Also reports urinary frequency

## 2018-01-22 NOTE — Discharge Instructions (Addendum)
You were seen in the ER for chills, nasal congestion, runny nose, abdominal pain.   Labs and urine looked normal.   Your symptoms are from a viral infection.   Use over the counter remedies such as sudafed, allergy medication and flonase spray   Return for fever, cough, chest pain, shortness of breath, vomiting, diarrhea

## 2019-09-19 DIAGNOSIS — Z30433 Encounter for removal and reinsertion of intrauterine contraceptive device: Secondary | ICD-10-CM | POA: Insufficient documentation

## 2019-10-23 ENCOUNTER — Emergency Department (HOSPITAL_COMMUNITY): Payer: Medicaid Other

## 2019-10-23 ENCOUNTER — Encounter (HOSPITAL_COMMUNITY): Payer: Self-pay

## 2019-10-23 ENCOUNTER — Emergency Department (HOSPITAL_COMMUNITY)
Admission: EM | Admit: 2019-10-23 | Discharge: 2019-10-23 | Disposition: A | Discharge status: Home / Self Care | Payer: Medicaid Other | Attending: Emergency Medicine | Admitting: Emergency Medicine

## 2019-10-23 ENCOUNTER — Other Ambulatory Visit: Payer: Self-pay

## 2019-10-23 DIAGNOSIS — Y92410 Unspecified street and highway as the place of occurrence of the external cause: Secondary | ICD-10-CM | POA: Insufficient documentation

## 2019-10-23 DIAGNOSIS — S39012A Strain of muscle, fascia and tendon of lower back, initial encounter: Secondary | ICD-10-CM | POA: Diagnosis not present

## 2019-10-23 DIAGNOSIS — S3992XA Unspecified injury of lower back, initial encounter: Secondary | ICD-10-CM | POA: Diagnosis present

## 2019-10-23 DIAGNOSIS — Y9389 Activity, other specified: Secondary | ICD-10-CM | POA: Diagnosis not present

## 2019-10-23 DIAGNOSIS — W2210XA Striking against or struck by unspecified automobile airbag, initial encounter: Secondary | ICD-10-CM

## 2019-10-23 DIAGNOSIS — Y998 Other external cause status: Secondary | ICD-10-CM | POA: Diagnosis not present

## 2019-10-23 DIAGNOSIS — Z7982 Long term (current) use of aspirin: Secondary | ICD-10-CM | POA: Diagnosis not present

## 2019-10-23 LAB — PREGNANCY, URINE: Preg Test, Ur: NEGATIVE
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
## 2019-10-23 MED ORDER — METHOCARBAMOL 500 MG PO TABS: 1000.0000 mg | ORAL_TABLET | Freq: Three times a day (TID) | ORAL | 0 refills | Status: AC
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
## 2019-11-21 ENCOUNTER — Other Ambulatory Visit: Payer: Self-pay | Admitting: Chiropractor

## 2019-11-21 DIAGNOSIS — M25532 Pain in left wrist: Secondary | ICD-10-CM

## 2019-11-21 DIAGNOSIS — M545 Low back pain, unspecified: Secondary | ICD-10-CM

## 2019-12-03 DIAGNOSIS — R87611 Atypical squamous cells cannot exclude high grade squamous intraepithelial lesion on cytologic smear of cervix (ASC-H): Secondary | ICD-10-CM | POA: Insufficient documentation

## 2019-12-14 ENCOUNTER — Ambulatory Visit
Admission: RE | Admit: 2019-12-14 | Discharge: 2019-12-14 | Disposition: A | Discharge status: Home / Self Care | Payer: Self-pay | Source: Ambulatory Visit | Attending: Chiropractor | Admitting: Chiropractor

## 2019-12-14 ENCOUNTER — Ambulatory Visit
Admission: RE | Admit: 2019-12-14 | Discharge: 2019-12-14 | Disposition: A | Discharge status: Home / Self Care | Payer: Medicaid Other | Source: Ambulatory Visit | Attending: Chiropractor | Admitting: Chiropractor

## 2019-12-14 DIAGNOSIS — M25532 Pain in left wrist: Secondary | ICD-10-CM

## 2019-12-14 DIAGNOSIS — M545 Low back pain, unspecified: Secondary | ICD-10-CM
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
## 2020-07-19 ENCOUNTER — Other Ambulatory Visit: Payer: Self-pay

## 2020-07-19 ENCOUNTER — Ambulatory Visit
Admission: EM | Admit: 2020-07-19 | Discharge: 2020-07-19 | Disposition: A | Discharge status: Home / Self Care | Payer: Medicaid Other | Attending: Emergency Medicine | Admitting: Emergency Medicine

## 2020-07-19 ENCOUNTER — Encounter: Payer: Self-pay | Admitting: Emergency Medicine

## 2020-07-19 DIAGNOSIS — J069 Acute upper respiratory infection, unspecified: Secondary | ICD-10-CM | POA: Diagnosis not present

## 2020-07-19 MED ORDER — IBUPROFEN 800 MG PO TABS: 800.0000 mg | ORAL_TABLET | Freq: Three times a day (TID) | ORAL | 0 refills | Status: DC

## 2020-07-19 MED ORDER — PSEUDOEPH-BROMPHEN-DM 30-2-10 MG/5ML PO SYRP: 10.0000 mL | ORAL_SOLUTION | Freq: Three times a day (TID) | ORAL | 0 refills | Status: DC | PRN

## 2020-07-19 MED ORDER — CETIRIZINE HCL 1 MG/ML PO SOLN: 10.0000 mg | Freq: Every day | ORAL | 0 refills | Status: DC

## 2020-07-19 MED ORDER — FLUTICASONE PROPIONATE 50 MCG/ACT NA SUSP: 1.0000 | Freq: Every day | NASAL | 0 refills | Status: DC

## 2020-07-19 NOTE — ED Triage Notes (Signed)
Pt sts sore throat and nasal congestion x 4 days; pt sts some chills; pt declines covid testing at present

## 2020-07-19 NOTE — Discharge Instructions (Addendum)
COVID test pending, monitor MyChart for results Begin daily cetirizine/Zyrtec and Flonase nasal spray to help with congestion and drainage Cough syrup as needed for further relief of cough/congestion Tylenol and ibuprofen as needed for headaches, body aches already fevers Rest and fluids Follow-up if not improving or worsening

## 2020-07-19 NOTE — ED Provider Notes (Signed)
[facility]    CSN: [id number] Arrival date & time: 07/19/20  0905      History   Chief Complaint Chief Complaint  Patient presents with  . Sore Throat  . Nasal Congestion    HPI Erica [name] is a 25 y.o. female presenting today for evaluation of URI symptoms.  Reports associated sore throat and congestion for approximately 4 days.  Has had associated chills.  Denies chest pain or shortness of breath.  COVID infection this past winter.  HPI  Past Medical History:  Diagnosis Date  . Medical history non-contributory     Patient Active Problem List   Diagnosis Date Noted  . Chlamydial infection 08/16/2014    Past Surgical History:  Procedure Laterality Date  . EYE SURGERY      OB History    Gravida  1   Para  1   Term  1   Preterm      AB      Living  1     SAB      IAB      Ectopic      Multiple      Live Births  1            Home Medications    Prior to Admission medications   Medication Sig Start Date End Date Taking? Authorizing Provider  brompheniramine-pseudoephedrine-DM 30-2-10 MG/5ML syrup Take 10 mLs by mouth 3 (three) times daily as needed. 07/19/20  Yes Chanya Chrisley C, PA-C  cetirizine HCl (ZYRTEC) 1 MG/ML solution Take 10 mLs (10 mg total) by mouth daily. 07/19/20  Yes Season Astacio C, PA-C  fluticasone (FLONASE) 50 MCG/ACT nasal spray Place 1-2 sprays into both nostrils daily. 07/19/20  Yes Brentt Fread C, PA-C  ibuprofen (ADVIL) 800 MG tablet Take 1 tablet (800 mg total) by mouth 3 (three) times daily. 07/19/20  Yes Arling Cerone C, PA-C  albuterol (PROVENTIL HFA;VENTOLIN HFA) 108 (90 BASE) MCG/ACT inhaler Inhale 2 puffs into the lungs every 2 (two) hours as needed for wheezing or shortness of breath (cough). 06/05/14   Street, Bowler, PA-C  levonorgestrel (MIRENA) 20 MCG/24HR IUD 1 each by Intrauterine route once. Administered 07/13/13.    [provider]    Family History History reviewed.  No pertinent family history.  Social History Social History   Tobacco Use  . Smoking status: Never Smoker  . Smokeless tobacco: Never Used  Vaping Use  . Vaping Use: Never used  Substance Use Topics  . Alcohol use: No    Alcohol/week: 0.0 standard drinks  . Drug use: No     Allergies   Patient has no known allergies.   Review of Systems Review of Systems  Constitutional: Positive for chills. Negative for activity change, appetite change, fatigue and fever.  HENT: Positive for congestion, rhinorrhea and sore throat. Negative for ear pain, sinus pressure and trouble swallowing.   Eyes: Negative for discharge and redness.  Respiratory: Positive for cough. Negative for chest tightness and shortness of breath.   Cardiovascular: Negative for chest pain.  Gastrointestinal: Negative for abdominal pain, diarrhea, nausea and vomiting.  Musculoskeletal: Negative for myalgias.  Skin: Negative for rash.  Neurological: Negative for dizziness, light-headedness and headaches.     Physical Exam Triage Vital Signs ED Triage Vitals  Enc Vitals Group     BP 07/19/20 0916 123/81     Pulse Rate 07/19/20 0916 71     Resp 07/19/20 0916 18  Temp 07/19/20 0916 98.2 F (36.8 C)     Temp Source 07/19/20 0916 Oral     SpO2 07/19/20 0916 97 %     Weight --      Height --      Head Circumference --      Peak Flow --      Pain Score 07/19/20 0918 5     Pain Loc --      Pain Edu? --      Excl. in GC? --    No data found.  Updated Vital Signs BP 123/81 (BP Location: Left Arm)   Pulse 71   Temp 98.2 F (36.8 C) (Oral)   Resp 18   SpO2 97%   Visual Acuity Right Eye Distance:   Left Eye Distance:   Bilateral Distance:    Right Eye Near:   Left Eye Near:    Bilateral Near:     Physical Exam Vitals and nursing note reviewed.  Constitutional:      Appearance: She is well-developed.     Comments: No acute distress  HENT:     Head: Normocephalic and atraumatic.     Ears:      Comments: Bilateral ears without tenderness to palpation of external auricle, tragus and mastoid, EAC's without erythema or swelling, TM's with good bony landmarks and cone of light. Non erythematous.     Nose: Nose normal.     Mouth/Throat:     Comments: Oral mucosa pink and moist, no tonsillar enlargement or exudate. Posterior pharynx patent and nonerythematous, no uvula deviation or swelling. Normal phonation. Eyes:     Conjunctiva/sclera: Conjunctivae normal.  Cardiovascular:     Rate and Rhythm: Normal rate and regular rhythm.  Pulmonary:     Effort: Pulmonary effort is normal. No respiratory distress.     Comments: Breathing comfortably at rest, CTABL, no wheezing, rales or other adventitious sounds auscultated Abdominal:     General: There is no distension.  Musculoskeletal:        General: Normal range of motion.     Cervical back: Neck supple.  Skin:    General: Skin is warm and dry.  Neurological:     Mental Status: She is alert and oriented to person, place, and time.      UC Treatments / Results  Labs (all labs ordered are listed, but only abnormal results are displayed) Labs Reviewed  NOVEL CORONAVIRUS, NAA    EKG   Radiology No results found.  Procedures Procedures (including critical care time)  Medications Ordered in UC Medications - No data to display  Initial Impression / Assessment and Plan / UC Course  I have reviewed the triage vital signs and the nursing notes.  Pertinent labs & imaging results that were available during my care of the patient were reviewed by me and considered in my medical decision making (see chart for details).     Viral URI with cough-COVID test pending, recommending symptomatic and supportive care rest and fluids.  Discussed strict return precautions. Patient verbalized understanding and is agreeable with plan.  Final Clinical Impressions(s) / UC Diagnoses   Final diagnoses:  Viral URI with cough     Discharge  Instructions     COVID test pending, monitor MyChart for results Begin daily cetirizine/Zyrtec and Flonase nasal spray to help with congestion and drainage Cough syrup as needed for further relief of cough/congestion Tylenol and ibuprofen as needed for headaches, body aches already fevers Rest and fluids Follow-up if  not improving or worsening    ED Prescriptions    Medication Sig Dispense Auth. Provider   ibuprofen (ADVIL) 800 MG tablet Take 1 tablet (800 mg total) by mouth 3 (three) times daily. 21 tablet Taysha Majewski C, PA-C   cetirizine HCl (ZYRTEC) 1 MG/ML solution Take 10 mLs (10 mg total) by mouth daily. 118 mL Melvie Paglia C, PA-C   fluticasone (FLONASE) 50 MCG/ACT nasal spray Place 1-2 sprays into both nostrils daily. 16 g Aarush Stukey C, PA-C   brompheniramine-pseudoephedrine-DM 30-2-10 MG/5ML syrup Take 10 mLs by mouth 3 (three) times daily as needed. 120 mL Erick Murin, [place] C, PA-C     PDMP not reviewed this encounter.   Lew Dawes, [place] 07/19/20 [phone]

## 2020-07-21 LAB — NOVEL CORONAVIRUS, NAA: SARS-CoV-2, NAA: NOT DETECTED

## 2020-10-19 ENCOUNTER — Ambulatory Visit (HOSPITAL_COMMUNITY)
Admission: EM | Admit: 2020-10-19 | Discharge: 2020-10-19 | Disposition: A | Discharge status: Home / Self Care | Payer: Medicaid Other

## 2020-10-19 ENCOUNTER — Emergency Department (HOSPITAL_COMMUNITY)
Admission: EM | Admit: 2020-10-19 | Discharge: 2020-10-19 | Disposition: A | Discharge status: Home / Self Care | Payer: Medicaid Other | Attending: Emergency Medicine | Admitting: Emergency Medicine

## 2020-10-19 ENCOUNTER — Emergency Department (HOSPITAL_COMMUNITY): Payer: Medicaid Other

## 2020-10-19 ENCOUNTER — Encounter (HOSPITAL_COMMUNITY): Payer: Self-pay

## 2020-10-19 ENCOUNTER — Other Ambulatory Visit: Payer: Self-pay

## 2020-10-19 DIAGNOSIS — Z20822 Contact with and (suspected) exposure to covid-19: Secondary | ICD-10-CM

## 2020-10-19 DIAGNOSIS — R0602 Shortness of breath: Secondary | ICD-10-CM | POA: Diagnosis not present

## 2020-10-19 DIAGNOSIS — R0789 Other chest pain: Secondary | ICD-10-CM

## 2020-10-19 DIAGNOSIS — U071 COVID-19: Secondary | ICD-10-CM | POA: Insufficient documentation

## 2020-10-19 DIAGNOSIS — J029 Acute pharyngitis, unspecified: Secondary | ICD-10-CM | POA: Diagnosis not present

## 2020-10-19 DIAGNOSIS — J069 Acute upper respiratory infection, unspecified: Secondary | ICD-10-CM | POA: Diagnosis not present

## 2020-10-19 DIAGNOSIS — R509 Fever, unspecified: Secondary | ICD-10-CM | POA: Diagnosis present

## 2020-10-19 LAB — RESP PANEL BY RT-PCR (FLU A&B, COVID) ARPGX2
Influenza A by PCR: NEGATIVE
Influenza B by PCR: NEGATIVE
SARS Coronavirus 2 by RT PCR: POSITIVE — AB

## 2020-10-19 LAB — CBC WITH DIFFERENTIAL/PLATELET
Abs Immature Granulocytes: 0.02 10*3/uL (ref 0.00–0.07)
Basophils Absolute: 0 10*3/uL (ref 0.0–0.1)
Basophils Relative: 0 %
Eosinophils Absolute: 0 10*3/uL (ref 0.0–0.5)
Eosinophils Relative: 0 %
HCT: 46.4 % — ABNORMAL HIGH (ref 36.0–46.0)
Hemoglobin: 16.1 g/dL — ABNORMAL HIGH (ref 12.0–15.0)
Immature Granulocytes: 0 %
Lymphocytes Relative: 14 %
Lymphs Abs: 0.9 10*3/uL (ref 0.7–4.0)
MCH: 32.8 pg (ref 26.0–34.0)
MCHC: 34.7 g/dL (ref 30.0–36.0)
MCV: 94.5 fL (ref 80.0–100.0)
Monocytes Absolute: 0.4 10*3/uL (ref 0.1–1.0)
Monocytes Relative: 6 %
Neutro Abs: 4.8 10*3/uL (ref 1.7–7.7)
Neutrophils Relative %: 80 %
Platelets: 221 10*3/uL (ref 150–400)
RBC: 4.91 MIL/uL (ref 3.87–5.11)
RDW: 11.7 % (ref 11.5–15.5)
WBC: 6.1 10*3/uL (ref 4.0–10.5)
nRBC: 0 % (ref 0.0–0.2)

## 2020-10-19 LAB — I-STAT BETA HCG BLOOD, ED (MC, WL, AP ONLY): I-stat hCG, quantitative: <5 m[IU]/mL (ref <5)

## 2020-10-19 LAB — BASIC METABOLIC PANEL
Anion gap: 7 (ref 5–15)
BUN: <5 mg/dL — ABNORMAL LOW (ref 6–20)
CO2: 24 mmol/L (ref 22–32)
Calcium: 8.9 mg/dL (ref 8.9–10.3)
Chloride: 106 mmol/L (ref 98–111)
Creatinine, Ser: 0.76 mg/dL (ref 0.44–1.00)
GFR, Estimated: >60 mL/min (ref >60)
Glucose, Bld: 90 mg/dL (ref 70–99)
Potassium: 3.7 mmol/L (ref 3.5–5.1)
Sodium: 137 mmol/L (ref 135–145)

## 2020-10-19 LAB — GROUP A STREP BY PCR: Group A Strep by PCR: NOT DETECTED

## 2020-10-19 LAB — TROPONIN I (HIGH SENSITIVITY)
Troponin I (High Sensitivity): 4 ng/L (ref <18)
Troponin I (High Sensitivity): 3 ng/L (ref <18)

## 2020-10-19 IMAGING — CR DG CHEST 2V
2 series · 2 of 2 positions shown · non-contrast
Comparison: Chest radiograph [DATE]

CLINICAL DATA: cough, SOB, cp

EXAM:
CHEST - 2 VIEW

[chest pa]
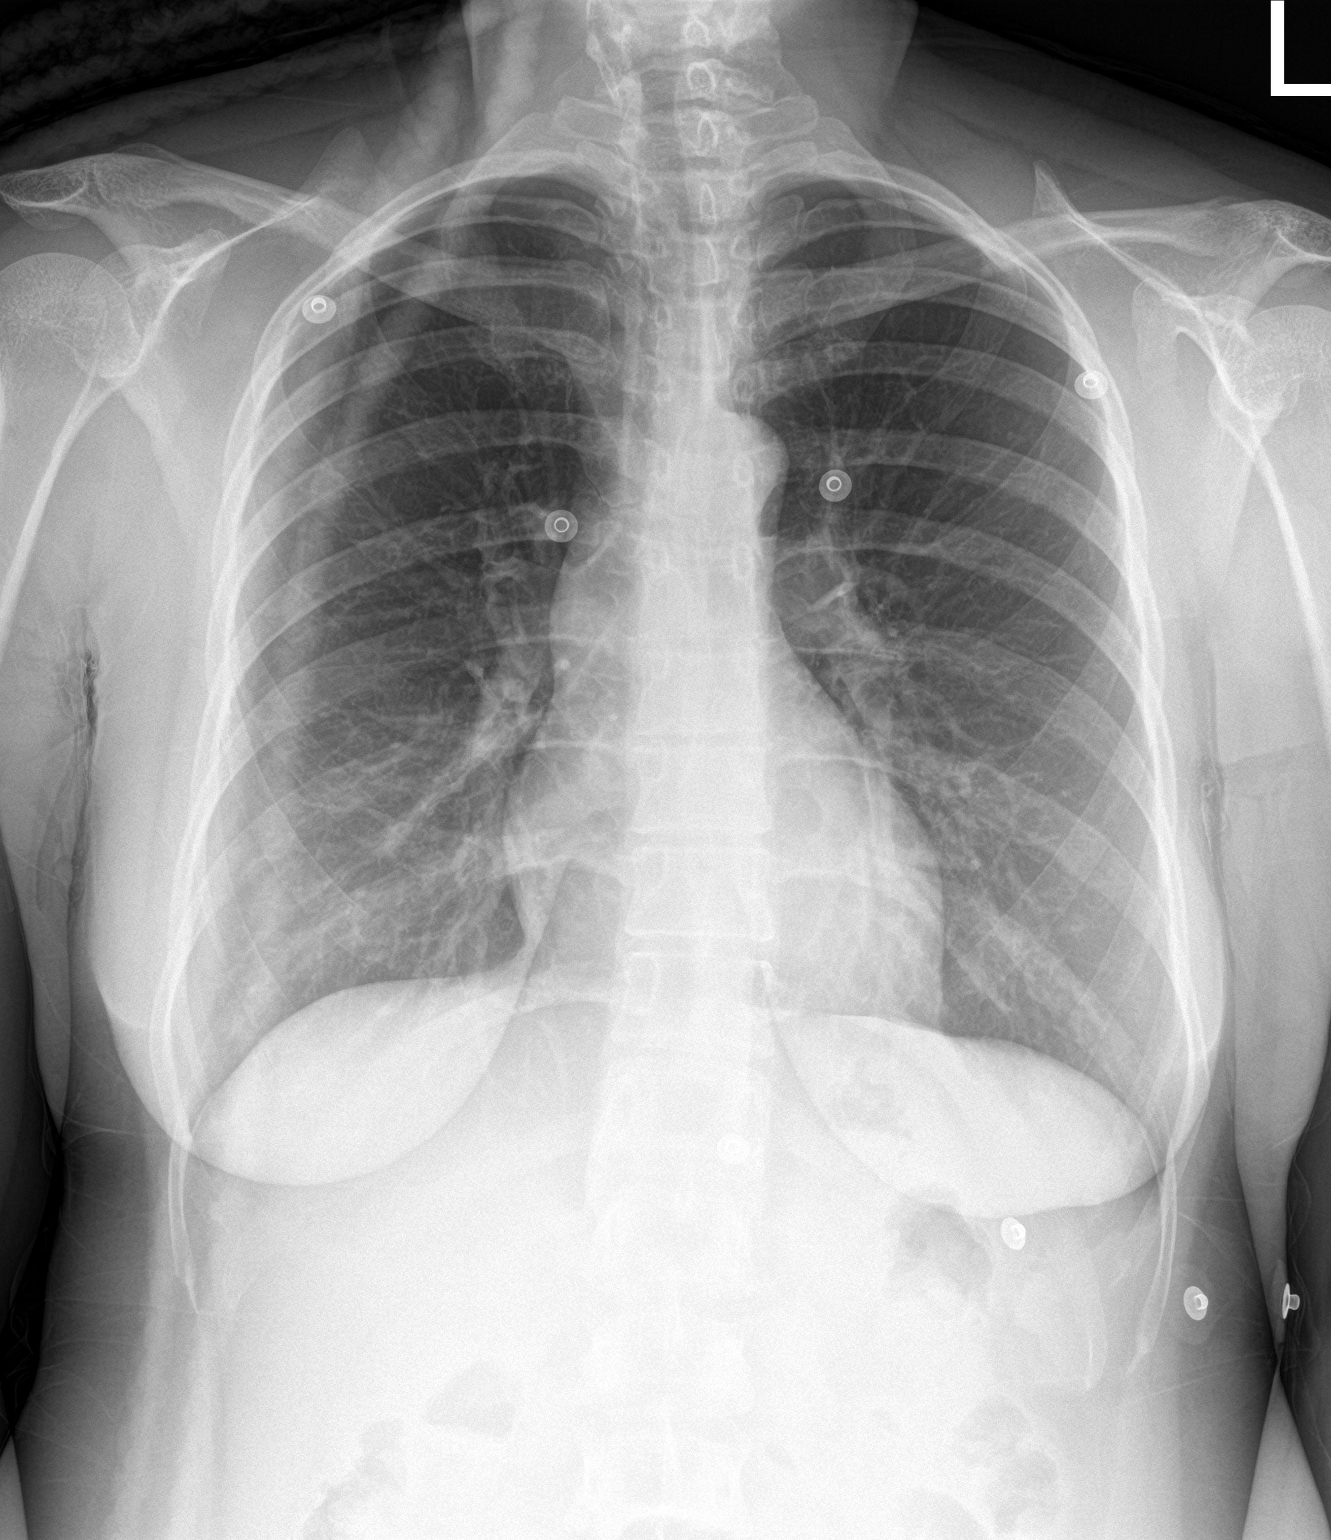

[chest lat]
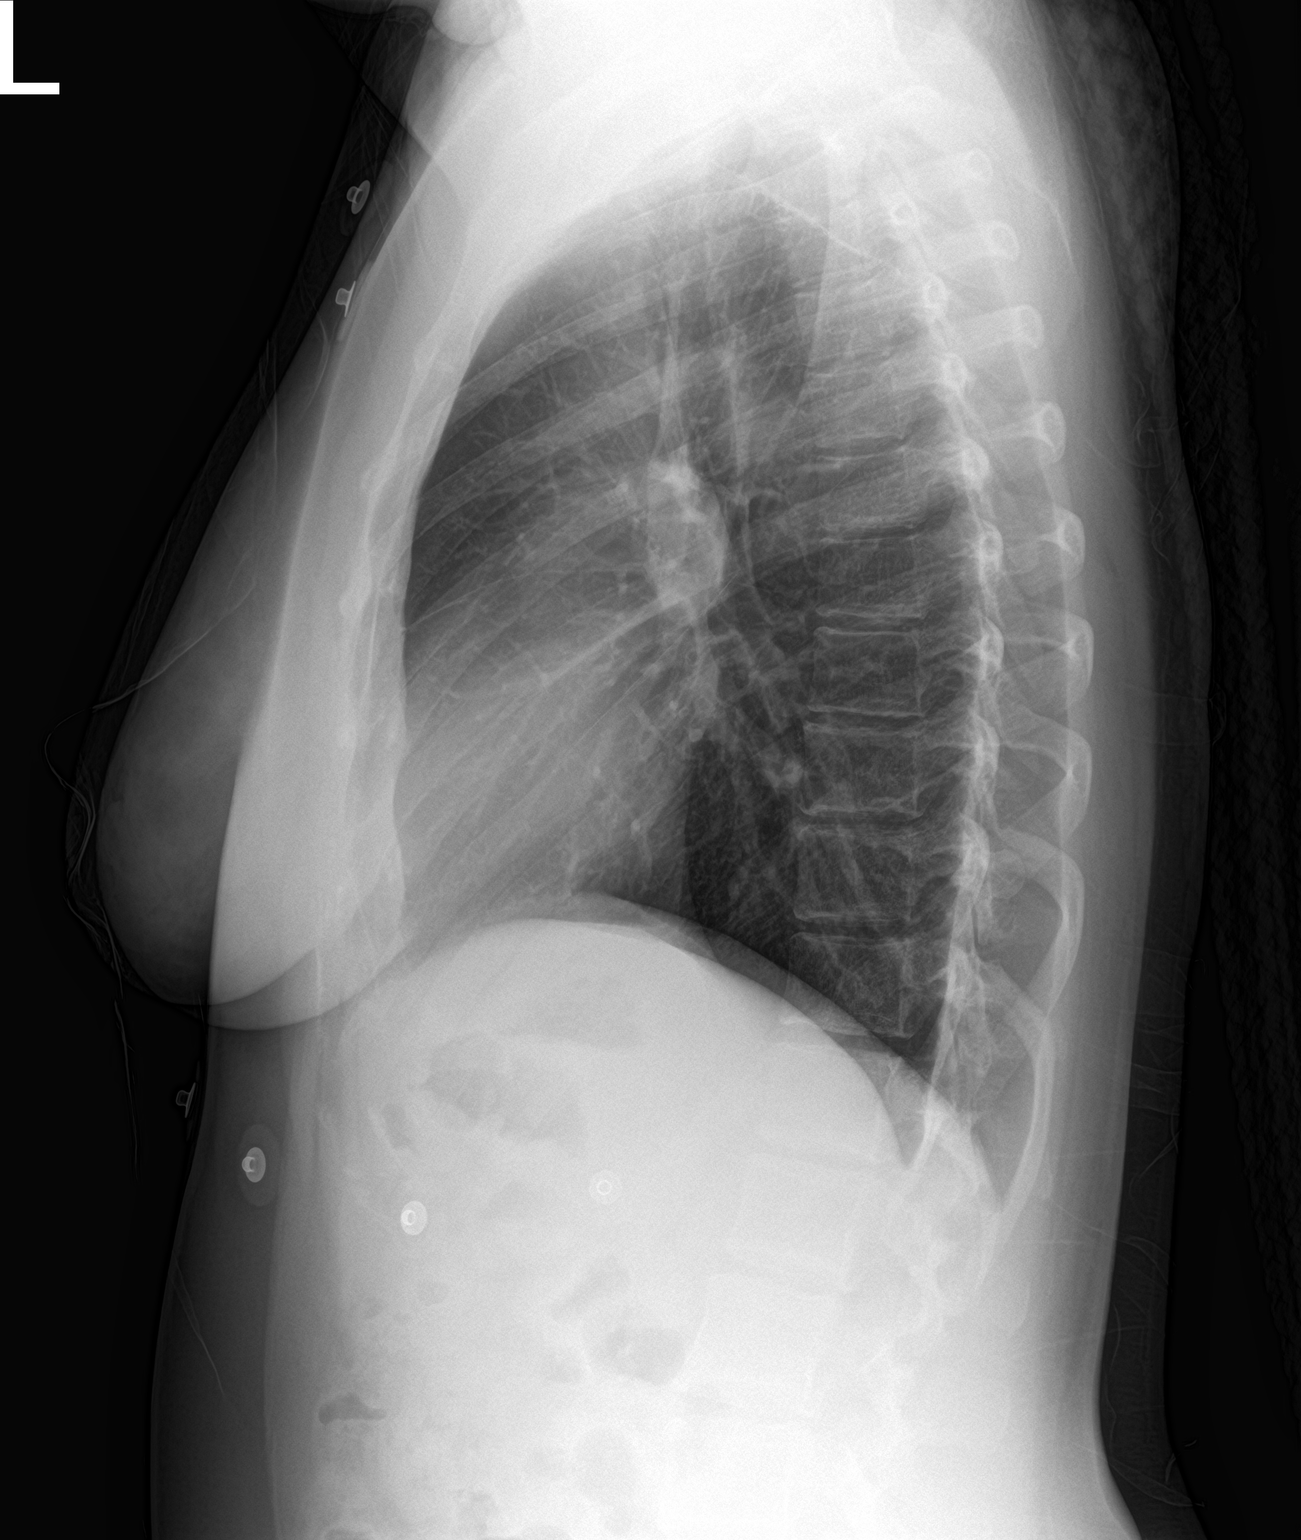

[2 of 2 positions shown; findings below may reference images not displayed]

FINDINGS: The cardiomediastinal contours are within normal limits. The lungs
are clear. No pneumothorax or pleural effusion. No acute finding in
the visualized skeleton.
IMPRESSION: No acute cardiopulmonary process.

## 2020-10-19 NOTE — ED Triage Notes (Signed)
Pt present fever, body aches and sore throat. Pt stats having SOB and chest pain when she takes a deep breath. The pain in her chest is on left side and hurts to cough and breath. Symptom started yesterday.

## 2020-10-19 NOTE — ED Notes (Addendum)
Pt in bed, pt states that she is here for sore throat, cough, general body aches and chill for the past two days, states that she was seen at urgent care and sent here because we could do the test quicker.  Pt talking in full sentences, resps even and unlabored

## 2020-10-19 NOTE — ED Triage Notes (Signed)
Patient complains of chest pain, cough, congestion and body aches with fever x 1 day. Sent from [facility]. Patient states that her cp worse with inspiration and cough

## 2020-10-19 NOTE — ED Provider Notes (Signed)
[facility]    CSN: [id number] Arrival date & time: 10/19/20  1031      History   Chief Complaint Chief Complaint  Patient presents with   Shortness of Breath   Sore Throat   Fever    HPI Erica [name] is a 25 y.o. female.   Patient presents to the urgent care with 1 day history of fever, body aches, sore throat, nasal congestion, shortness of breath, chest pain.  Patient states that chest pain is constant and "achy", Also having intermittent shortness of breath.  Denies any chronic lung conditions or cardiac history.  Denies any known sick contacts.  Patient has taken over-the-counter Tylenol for symptoms.  T-max at home was 99.   Shortness of Breath Sore Throat  Fever  Past Medical History:  Diagnosis Date   Medical history non-contributory     Patient Active Problem List   Diagnosis Date Noted   Chlamydial infection 08/16/2014    Past Surgical History:  Procedure Laterality Date   EYE SURGERY      OB History     Gravida  1   Para  1   Term  1   Preterm      AB      Living  1      SAB      IAB      Ectopic      Multiple      Live Births  1            Home Medications    Prior to Admission medications   Medication Sig Start Date End Date Taking? Authorizing Provider  albuterol (PROVENTIL HFA;VENTOLIN HFA) 108 (90 BASE) MCG/ACT inhaler Inhale 2 puffs into the lungs every 2 (two) hours as needed for wheezing or shortness of breath (cough). 06/05/14   Street, Ramer, PA-C  brompheniramine-pseudoephedrine-DM 30-2-10 MG/5ML syrup Take 10 mLs by mouth 3 (three) times daily as needed. 07/19/20   Wieters, Hallie C, PA-C  cetirizine HCl (ZYRTEC) 1 MG/ML solution Take 10 mLs (10 mg total) by mouth daily. 07/19/20   Wieters, Hallie C, PA-C  fluticasone (FLONASE) 50 MCG/ACT nasal spray Place 1-2 sprays into both nostrils daily. 07/19/20   Wieters, Hallie C, PA-C  ibuprofen (ADVIL) 800 MG tablet Take 1 tablet (800 mg total) by  mouth 3 (three) times daily. 07/19/20   Wieters, Hallie C, PA-C  levonorgestrel (MIRENA) 20 MCG/24HR IUD 1 each by Intrauterine route once. Administered 07/13/13.    [provider]    Family History History reviewed. No pertinent family history.  Social History Social History   Tobacco Use   Smoking status: Never   Smokeless tobacco: Never  Vaping Use   Vaping Use: Never used  Substance Use Topics   Alcohol use: No    Alcohol/week: 0.0 standard drinks   Drug use: No     Allergies   Patient has no known allergies.   Review of Systems Review of Systems Per HPI  Physical Exam Triage Vital Signs ED Triage Vitals  Enc Vitals Group     BP 10/19/20 1143 (!) 118/91     Pulse Rate 10/19/20 1143 (!) 103     Resp 10/19/20 1143 16     Temp 10/19/20 1143 99.4 F (37.4 C)     Temp Source 10/19/20 1143 Oral     SpO2 10/19/20 1143 100 %     Weight --      Height --  Head Circumference --      Peak Flow --      Pain Score 10/19/20 1145 0     Pain Loc --      Pain Edu? --      Excl. in GC? --    No data found.  Updated Vital Signs BP (!) 118/91 (BP Location: Left Arm)   Pulse (!) 103   Temp 99.4 F (37.4 C) (Oral)   Resp 16   SpO2 100%   Visual Acuity Right Eye Distance:   Left Eye Distance:   Bilateral Distance:    Right Eye Near:   Left Eye Near:    Bilateral Near:     Physical Exam Constitutional:      General: She is not in acute distress.    Appearance: Normal appearance.  HENT:     Head: Normocephalic and atraumatic.     Right Ear: Tympanic membrane and ear canal normal.     Left Ear: Tympanic membrane and ear canal normal.     Nose: Congestion present.     Mouth/Throat:     Mouth: Mucous membranes are moist.     Pharynx: Posterior oropharyngeal erythema present.  Eyes:     Extraocular Movements: Extraocular movements intact.     Conjunctiva/sclera: Conjunctivae normal.     Pupils: Pupils are equal, round, and reactive to light.   Cardiovascular:     Rate and Rhythm: Normal rate and regular rhythm.     Pulses: Normal pulses.     Heart sounds: Normal heart sounds.  Pulmonary:     Effort: Pulmonary effort is normal. No respiratory distress.     Breath sounds: Normal breath sounds. No wheezing, rhonchi or rales.  Abdominal:     General: Abdomen is flat. Bowel sounds are normal.     Palpations: Abdomen is soft.  Musculoskeletal:        General: Normal range of motion.     Cervical back: Normal range of motion.  Skin:    General: Skin is warm and dry.  Neurological:     General: No focal deficit present.     Mental Status: She is alert and oriented to person, place, and time. Mental status is at baseline.  Psychiatric:        Mood and Affect: Mood normal.        Behavior: Behavior normal.     UC Treatments / Results  Labs (all labs ordered are listed, but only abnormal results are displayed) Labs Reviewed - No data to display  EKG   Radiology No results found.  Procedures Procedures (including critical care time)  Medications Ordered in UC Medications - No data to display  Initial Impression / Assessment and Plan / UC Course  I have reviewed the triage vital signs and the nursing notes.  Pertinent labs & imaging results that were available during my care of the patient were reviewed by me and considered in my medical decision making (see chart for details).     Suspect viral upper respiratory infection as cause of symptoms.  Suspect chest pain and shortness of breath is related to inflammation of lungs causing symptoms.  Although, unable to rule out any worrisome signs and symptoms including pulmonary embolism that could be related to COVID-19 as patient is having shortness of breath, chest pain, mild tachycardia.  Patient was advised that she should go to the hospital for further evaluation and management due to the symptoms.  Offered to start work-up with patient  with EKG, COVID-19 test, strep  test, chest x-ray but patient declined with shared decision making as she wishes for these tests to be performed at hospital.  Patient was agreeable with plan.  Patient stated that she would go to the hospital as soon as she left urgent care. Final Clinical Impressions(s) / UC Diagnoses   Final diagnoses:  Viral upper respiratory tract infection  Sore throat  Shortness of breath  Other chest pain     Discharge Instructions      Please go to the hospital as soon as you leave urgent care for further evaluation and management of chest pain and shortness of breath.  Suspicious of possible blood clot in lung due to symptoms and heart rate.     ED Prescriptions   None    PDMP not reviewed this encounter.   Lance Muss, FNP 10/19/20 1245

## 2020-10-19 NOTE — Discharge Instructions (Addendum)
Please go to the hospital as soon as you leave urgent care for further evaluation and management of chest pain and shortness of breath.  Suspicious of possible blood clot in lung due to symptoms and heart rate.

## 2020-10-19 NOTE — ED Notes (Signed)
Patient is being discharged from the Urgent Care and sent to the Emergency Department via POV . Per Henrene Dodge, PA, patient is in need of higher level of care due to chest pain and tachycardia. Patient is aware and verbalizes understanding of plan of care.  Vitals:   10/19/20 1143  BP: (!) 118/91  Pulse: (!) 103  Resp: 16  Temp: 99.4 F (37.4 C)  SpO2: 100%

## 2020-10-19 NOTE — ED Provider Notes (Signed)
[facility] Provider Note  CSN: [id number] Arrival date & time: 10/19/20 1258    History Chief Complaint  Patient presents with   Chest Pain    Erica [name] is a 25 y.o. female with no significant PMH reports she began having a low grade fever, myalgias, dry cough, sore throat, and occasional sharp midsternal chest pains, worse with deep breath. She went to [facility] and was sent to the ED for evaluation. She denies any sick contacts. Would like to be checked for Covid and Strep.    Past Medical History:  Diagnosis Date   Medical history non-contributory     Past Surgical History:  Procedure Laterality Date   EYE SURGERY      No family history on file.  Social History   Tobacco Use   Smoking status: Never   Smokeless tobacco: Never  Vaping Use   Vaping Use: Never used  Substance Use Topics   Alcohol use: No    Alcohol/week: 0.0 standard drinks   Drug use: No     Home Medications Prior to Admission medications   Medication Sig Start Date End Date Taking? Authorizing Provider  albuterol (PROVENTIL HFA;VENTOLIN HFA) 108 (90 BASE) MCG/ACT inhaler Inhale 2 puffs into the lungs every 2 (two) hours as needed for wheezing or shortness of breath (cough). 06/05/14   Street, Cameron, PA-C  brompheniramine-pseudoephedrine-DM 30-2-10 MG/5ML syrup Take 10 mLs by mouth 3 (three) times daily as needed. 07/19/20   Wieters, Hallie C, PA-C  cetirizine HCl (ZYRTEC) 1 MG/ML solution Take 10 mLs (10 mg total) by mouth daily. 07/19/20   Wieters, Hallie C, PA-C  fluticasone (FLONASE) 50 MCG/ACT nasal spray Place 1-2 sprays into both nostrils daily. 07/19/20   Wieters, Hallie C, PA-C  ibuprofen (ADVIL) 800 MG tablet Take 1 tablet (800 mg total) by mouth 3 (three) times daily. 07/19/20   Wieters, Hallie C, PA-C  levonorgestrel (MIRENA) 20 MCG/24HR IUD 1 each by Intrauterine route once. Administered 07/13/13.    [provider]     Allergies    Patient has no  known allergies.   Review of Systems   Review of Systems A comprehensive review of systems was completed and negative except as noted in HPI.    Physical Exam BP (!) 127/91 (BP Location: Right Arm)   Pulse 94   Temp 99.7 F (37.6 C) (Oral)   Resp 18   SpO2 100%   Physical Exam Vitals and nursing note reviewed.  Constitutional:      Appearance: Normal appearance.  HENT:     Head: Normocephalic and atraumatic.     Nose: Nose normal.     Mouth/Throat:     Mouth: Mucous membranes are moist.  Eyes:     Extraocular Movements: Extraocular movements intact.     Conjunctiva/sclera: Conjunctivae normal.  Cardiovascular:     Rate and Rhythm: Normal rate.  Pulmonary:     Effort: Pulmonary effort is normal.     Breath sounds: Normal breath sounds.  Abdominal:     General: Abdomen is flat.     Palpations: Abdomen is soft.     Tenderness: There is no abdominal tenderness.  Musculoskeletal:        General: No swelling. Normal range of motion.     Cervical back: Neck supple.  Skin:    General: Skin is warm and dry.  Neurological:     General: No focal deficit present.     Mental Status: She is alert.  Psychiatric:        Mood and Affect: Mood normal.     ED Results / Procedures / Treatments   Labs (all labs ordered are listed, but only abnormal results are displayed) Labs Reviewed  CBC WITH DIFFERENTIAL/PLATELET - Abnormal; Notable for the following components:      Result Value   Hemoglobin 16.1 (*)    HCT 46.4 (*)    All other components within normal limits  BASIC METABOLIC PANEL - Abnormal; Notable for the following components:   BUN <5 (*)    All other components within normal limits  GROUP A STREP BY PCR  RESP PANEL BY RT-PCR (FLU A&B, COVID) [id number]  I-STAT BETA HCG BLOOD, ED ([facility], [facility], [facility] ONLY)  TROPONIN I (HIGH SENSITIVITY)  TROPONIN I (HIGH SENSITIVITY)    EKG EKG Interpretation  Date/Time:  Sunday October 19 2020 13:06:26 EDT Ventricular Rate:  89 PR  Interval:  124 QRS Duration: 84 QT Interval:  356 QTC Calculation: 433 R Axis:   35 Text Interpretation: Normal sinus rhythm Septal infarct , age undetermined Abnormal ECG No old tracing to compare Confirmed by Susy Frizzle [phone]) on 10/19/2020 4:03:40 PM  Radiology DG Chest 2 View  Result Date: 10/19/2020 CLINICAL DATA:  cough, SOB, cp EXAM: CHEST - 2 VIEW COMPARISON:  Chest radiograph 10/23/2019 FINDINGS: The cardiomediastinal contours are within normal limits. The lungs are clear. No pneumothorax or pleural effusion. No acute finding in the visualized skeleton. IMPRESSION: No acute cardiopulmonary process. Electronically Signed   By: Emmaline Kluver M.D.   On: 10/19/2020 13:46    Procedures Procedures  Medications Ordered in the ED Medications - No data to display   MDM Rules/Calculators/A&P MDM Patient with symptoms consistent with viral syndrome. Sent for UC due to complaints of chest pain. Last episode of pain was <3hours ago. Labs and CXR ordered in triage reviewed and negative, including first Trop. No concern for PE. Will add Covid and strep per patient's symptoms. Second Trop is pending.   ED Course  I have reviewed the triage vital signs and the nursing notes.  Pertinent labs & imaging results that were available during my care of the patient were reviewed by me and considered in my medical decision making (see chart for details).  Clinical Course as of 10/19/20 1807  Sun Oct 19, 2020  1705 Trop #2 remains normal. Awaiting strep, Covid will not result during the ED visit.  [CS]  1804 Strep is neg. Patient advised to rest, drink plenty of fluids and OTC cold medications as needed. PCP follow up. Quarantine instructions given if Covid is positive.  [CS]    Clinical Course User Index [CS] Pollyann Savoy, MD    Final Clinical Impression(s) / ED Diagnoses Final diagnoses:  Atypical chest pain  Viral URI  Person under investigation for COVID-19    Rx / DC  Orders ED Discharge Orders     None        Pollyann Savoy, MD 10/19/20 1807

## 2020-10-19 NOTE — ED Notes (Signed)
Pt in bed, pt states that he is ready to go home, md notified of temp, offered pt tylenol states that she will take tylenol when she gets home, states that she can't swallow pills. Pt verbalized understanding d/c instructions and follow up, pt from dpt

## 2020-10-19 NOTE — ED Provider Notes (Signed)
Emergency Medicine Provider Triage Evaluation Note  Erica [name] , a 25 y.o. female  was evaluated in triage.  Pt complains of CP, Sob, cough, Also with myalgias. CP constant. No hx of PE, DVT. No LE edema. Seen at [facility] sent here for further eval.  Review of Systems  Positive: CP, SOB, cough Negative: Weakness, fever  Physical Exam  There were no vitals taken for this visit. Gen:   Awake, no distress   Resp:  Normal effort  MSK:   Moves extremities without difficulty, LE without edema Other:    Medical Decision Making  Medically screening exam initiated at 1:08 PM.  Appropriate orders placed.  [name] was informed that the remainder of the evaluation will be completed by another provider, this initial triage assessment does not replace that evaluation, and the importance of remaining in the ED until their evaluation is complete.  CP, cough   Lucero Ide A, PA-C 10/19/20 1309    Terald Sleeper, MD 10/20/20 [phone]

## 2020-12-02 ENCOUNTER — Other Ambulatory Visit: Payer: Self-pay

## 2020-12-02 ENCOUNTER — Encounter (HOSPITAL_COMMUNITY): Payer: Self-pay

## 2020-12-02 ENCOUNTER — Emergency Department (HOSPITAL_COMMUNITY): Payer: Medicaid Other

## 2020-12-02 ENCOUNTER — Emergency Department (HOSPITAL_COMMUNITY)
Admission: EM | Admit: 2020-12-02 | Discharge: 2020-12-02 | Disposition: A | Discharge status: Home / Self Care | Payer: Medicaid Other | Attending: Emergency Medicine | Admitting: Emergency Medicine

## 2020-12-02 ENCOUNTER — Emergency Department (HOSPITAL_COMMUNITY)
Admit: 2020-12-02 | Discharge: 2020-12-02 | Disposition: A | Discharge status: Home / Self Care | Payer: Medicaid Other | Attending: Emergency Medicine | Admitting: Emergency Medicine

## 2020-12-02 DIAGNOSIS — R55 Syncope and collapse: Secondary | ICD-10-CM | POA: Diagnosis not present

## 2020-12-02 DIAGNOSIS — R410 Disorientation, unspecified: Secondary | ICD-10-CM | POA: Insufficient documentation

## 2020-12-02 DIAGNOSIS — W1839XA Other fall on same level, initial encounter: Secondary | ICD-10-CM | POA: Insufficient documentation

## 2020-12-02 DIAGNOSIS — N9489 Other specified conditions associated with female genital organs and menstrual cycle: Secondary | ICD-10-CM | POA: Insufficient documentation

## 2020-12-02 DIAGNOSIS — Z79899 Other long term (current) drug therapy: Secondary | ICD-10-CM | POA: Diagnosis not present

## 2020-12-02 DIAGNOSIS — R569 Unspecified convulsions: Secondary | ICD-10-CM | POA: Diagnosis not present

## 2020-12-02 DIAGNOSIS — R251 Tremor, unspecified: Secondary | ICD-10-CM | POA: Insufficient documentation

## 2020-12-02 LAB — URINALYSIS, ROUTINE W REFLEX MICROSCOPIC
Glucose, UA: NEGATIVE mg/dL
Ketones, ur: 80 mg/dL — AB
Leukocytes,Ua: NEGATIVE
Nitrite: NEGATIVE
Protein, ur: NEGATIVE mg/dL
Specific Gravity, Urine: 1.02 (ref 1.005–1.030)
pH: 6 (ref 5.0–8.0)

## 2020-12-02 LAB — CBC
HCT: 41.5 % (ref 36.0–46.0)
Hemoglobin: 14.3 g/dL (ref 12.0–15.0)
MCH: 32.5 pg (ref 26.0–34.0)
MCHC: 34.5 g/dL (ref 30.0–36.0)
MCV: 94.3 fL (ref 80.0–100.0)
Platelets: 247 10*3/uL (ref 150–400)
RBC: 4.4 MIL/uL (ref 3.87–5.11)
RDW: 12.3 % (ref 11.5–15.5)
WBC: 6.2 10*3/uL (ref 4.0–10.5)
nRBC: 0 % (ref 0.0–0.2)

## 2020-12-02 LAB — RAPID URINE DRUG SCREEN, HOSP PERFORMED
Amphetamines: NOT DETECTED
Barbiturates: NOT DETECTED
Benzodiazepines: NOT DETECTED
Cocaine: NOT DETECTED
Opiates: NOT DETECTED
Tetrahydrocannabinol: NOT DETECTED

## 2020-12-02 LAB — BASIC METABOLIC PANEL
Anion gap: 6 (ref 5–15)
BUN: 9 mg/dL (ref 6–20)
CO2: 26 mmol/L (ref 22–32)
Calcium: 9 mg/dL (ref 8.9–10.3)
Chloride: 112 mmol/L — ABNORMAL HIGH (ref 98–111)
Creatinine, Ser: 0.52 mg/dL (ref 0.44–1.00)
GFR, Estimated: >60 mL/min (ref >60)
Glucose, Bld: 90 mg/dL (ref 70–99)
Potassium: 3.3 mmol/L — ABNORMAL LOW (ref 3.5–5.1)
Sodium: 144 mmol/L (ref 135–145)

## 2020-12-02 LAB — I-STAT BETA HCG BLOOD, ED (MC, WL, AP ONLY): I-stat hCG, quantitative: <5 m[IU]/mL (ref <5)

## 2020-12-02 LAB — CBG MONITORING, ED: Glucose-Capillary: 86 mg/dL (ref 70–99)

## 2020-12-02 LAB — MAGNESIUM: Magnesium: 2 mg/dL (ref 1.7–2.4)

## 2020-12-02 IMAGING — CT CT HEAD W/O CM
3 series · 14 of 46 positions shown, 16 images · non-contrast
Comparison: None.

CLINICAL DATA: Recent seizure activity with altered mental status,
initial encounter

EXAM:
CT HEAD WITHOUT CONTRAST
TECHNIQUE: Contiguous axial images were obtained from the base of the skull
through the vertex without intravenous contrast.

[Series 2: head wo · axial · 0.47mm/px · z∈[-95,+25]mm · 8 of 29 slices shown, 10 images]
[im 3/29  brain]
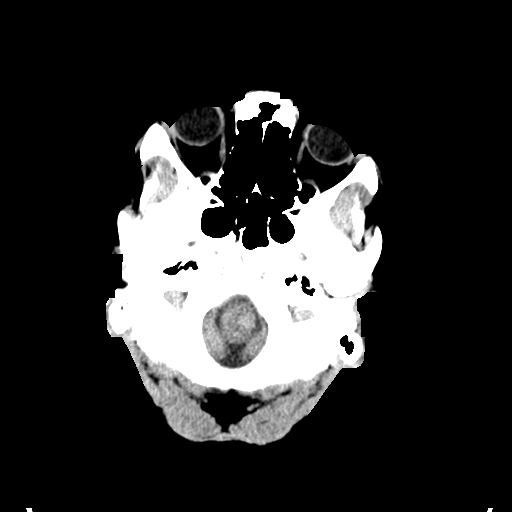
[im 3/29  bone]
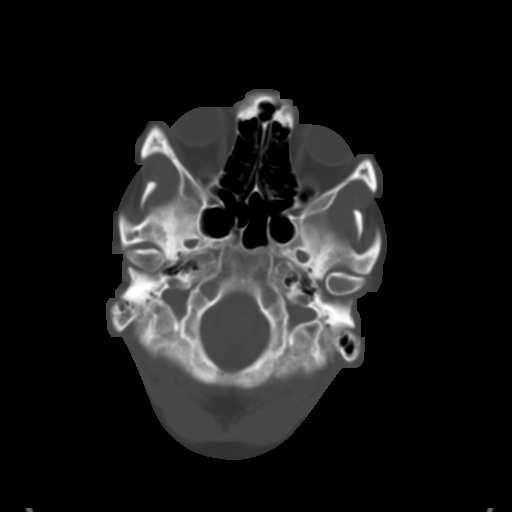
[im 7/29  brain]
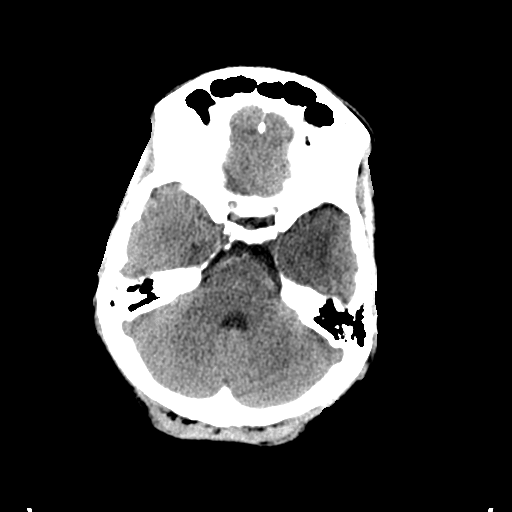
[im 10/29  brain]
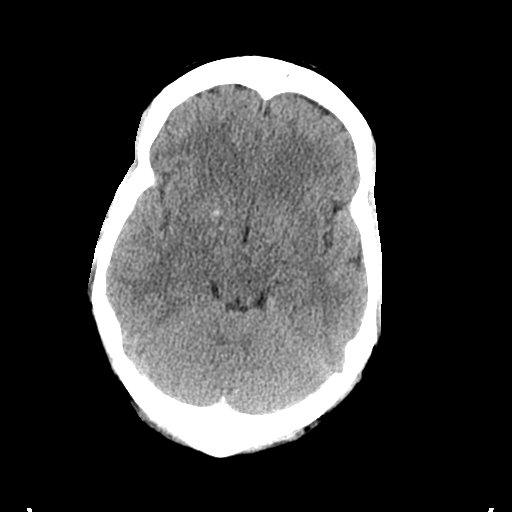
[im 13/29  brain]
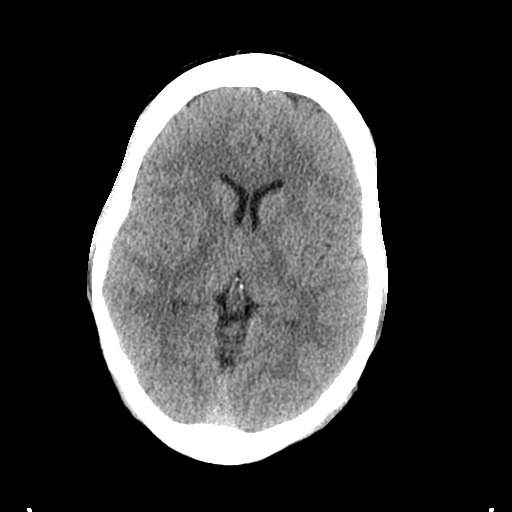
[im 17/29  brain]
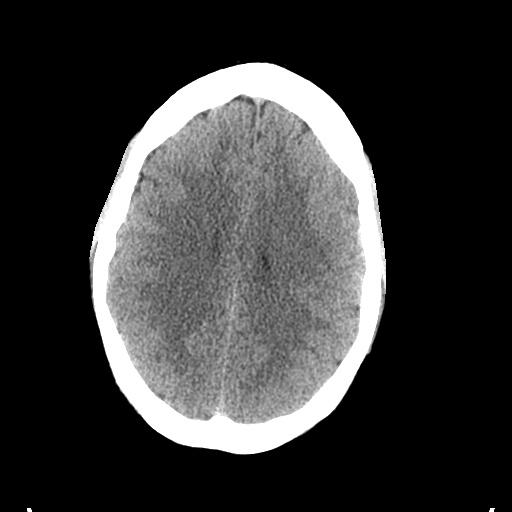
[im 17/29  bone]
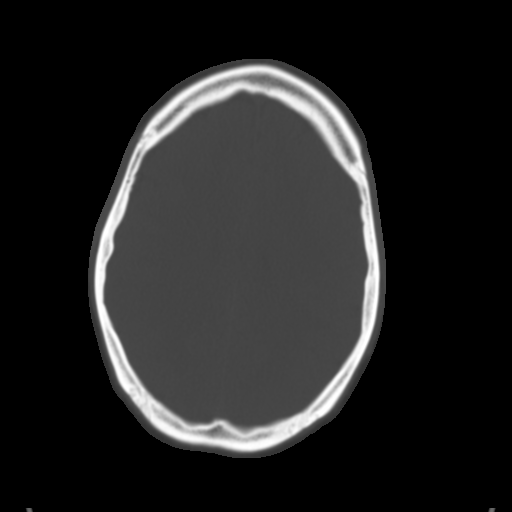
[im 20/29  brain]
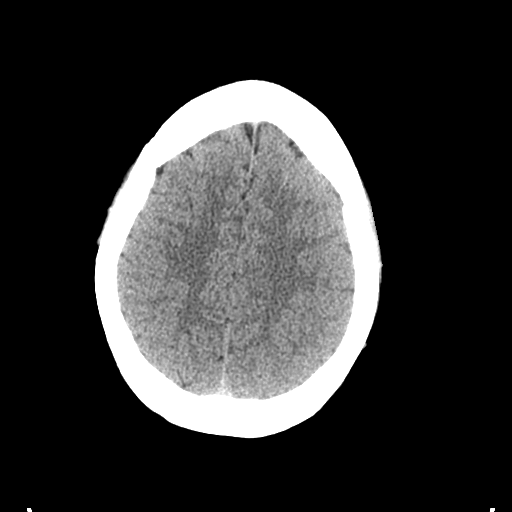
[im 23/29  brain]
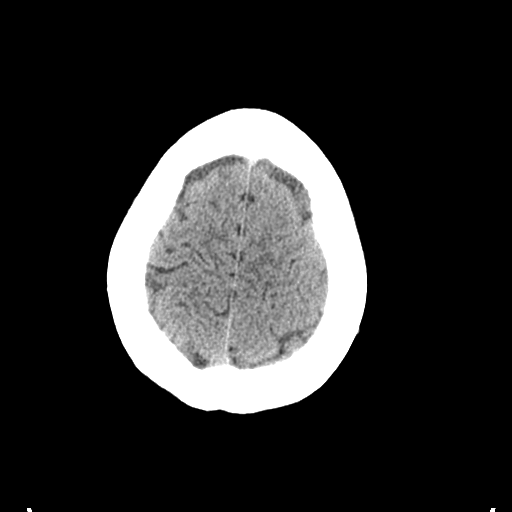
[im 27/29  brain]
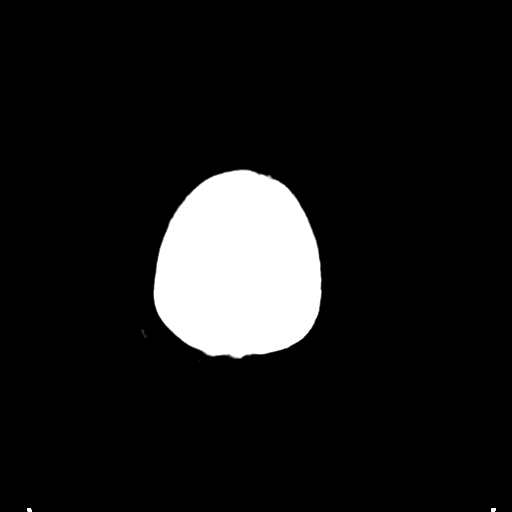

[Series 5: coronal soft tissue · coronal · 0.36mm/px · 3 of 65 slices shown]
[im 22/65  brain]
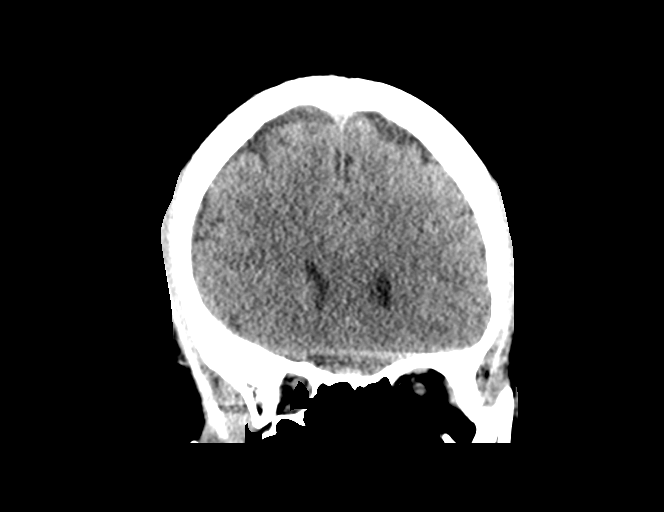
[im 29/65  brain]
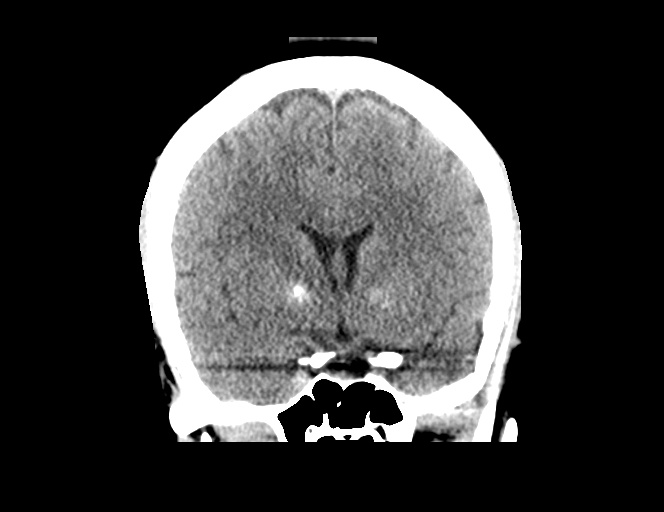
[im 36/65  brain]
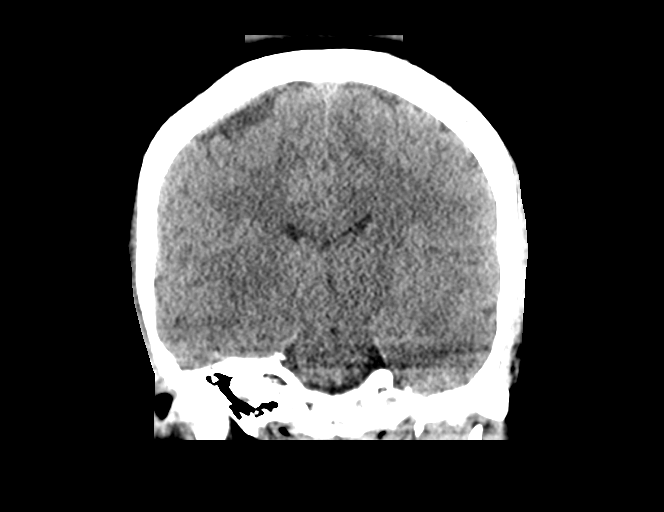

[Series 6: sagittal soft tissue · sagittal · 0.34mm/px · 3 of 48 slices shown]
[im 16/48  brain]
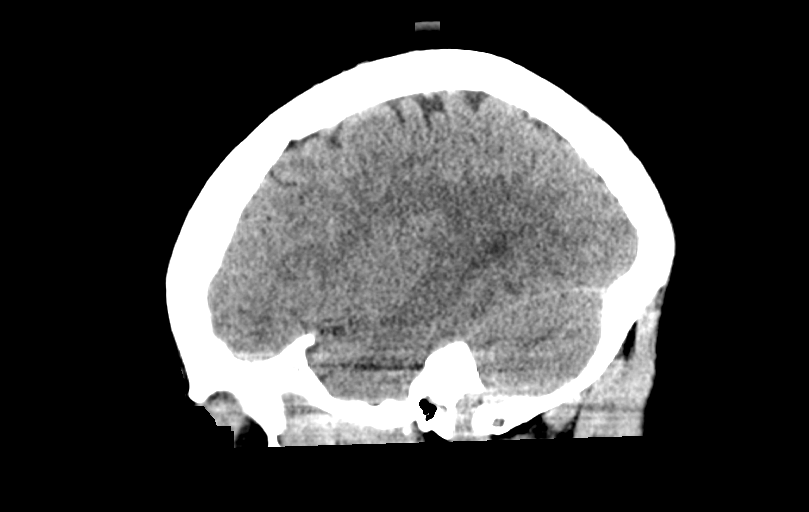
[im 24/48  brain]
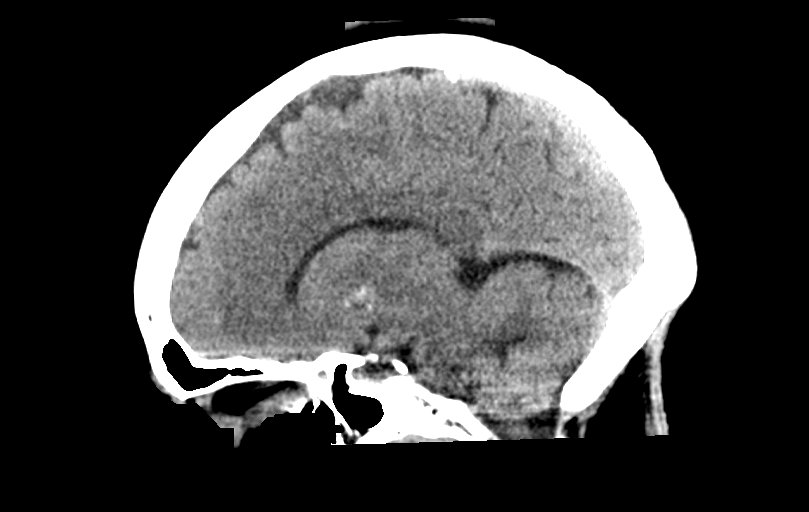
[im 32/48  brain]
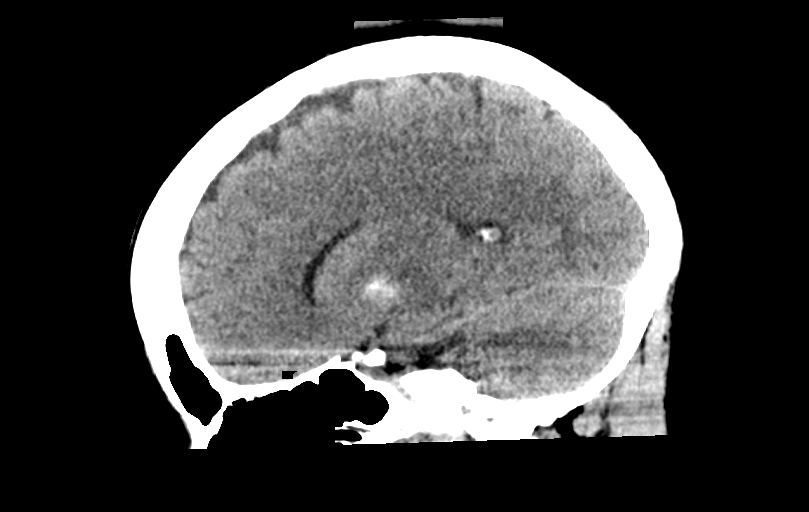

[14 of 46 positions shown; findings below may reference images not displayed]

FINDINGS: Brain: No evidence of acute infarction, hemorrhage, hydrocephalus,
extra-axial collection or mass lesion/mass effect. Bilateral basal
ganglia calcifications are noted.

Vascular: No hyperdense vessel or unexpected calcification.

Skull: Normal. Negative for fracture or focal lesion.

Sinuses/Orbits: No acute finding.

Other: None.
IMPRESSION: No acute intracranial abnormality noted.

## 2020-12-02 IMAGING — MR MR HEAD W/O CM
12 series · 48 of 48 positions shown · non-contrast
Comparison: None.

CLINICAL DATA: Seizure

EXAM:
MRI HEAD WITHOUT CONTRAST
TECHNIQUE: Multiplanar, multiecho pulse sequences of the brain and surrounding
structures were obtained without intravenous contrast.

[Series 5: DWI · axial · 3.0mm · 1.36mm/px · z∈[-55,+90]mm · 6 of 100 slices shown (1 of 2)]
[im 1/100]
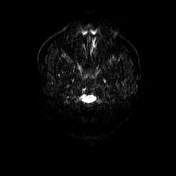
[im 20/100]
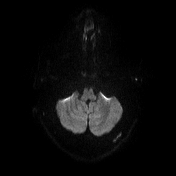
[im 40/100]
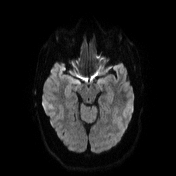
[im 60/100]
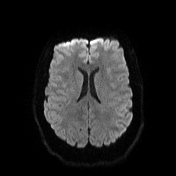
[im 80/100]
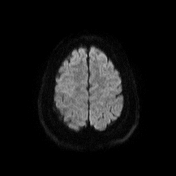
[im 100/100]
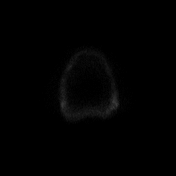

[Series 6: DWI · axial · 3.0mm · 1.36mm/px · z∈[-55,+90]mm · 3 of 50 slices shown (2 of 2)]
[im 1/50]
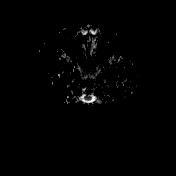
[im 25/50]
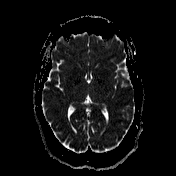
[im 50/50]
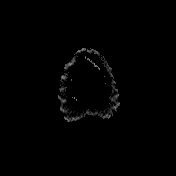

[Series 7: T1 · sagittal · 5.0mm · 0.75mm/px · 2 of 24 slices shown (1 of 2)]
[im 1/24]
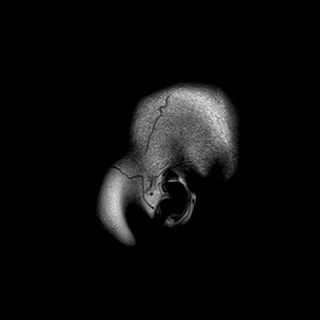
[im 24/24]
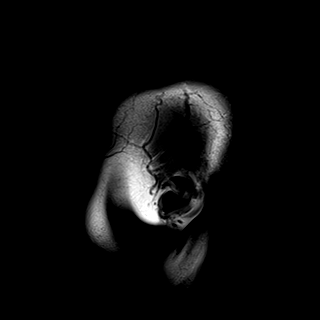

[Series 8: T2 · axial · 5.0mm · 0.62mm/px · z∈[-65,+95]mm · 2 of 26 slices shown (1 of 4)]
[im 1/26]
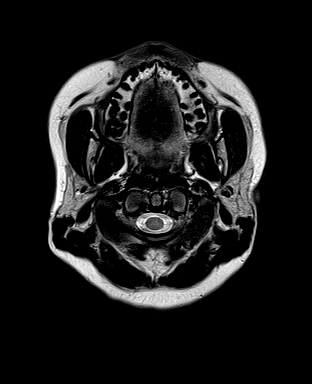
[im 26/26]
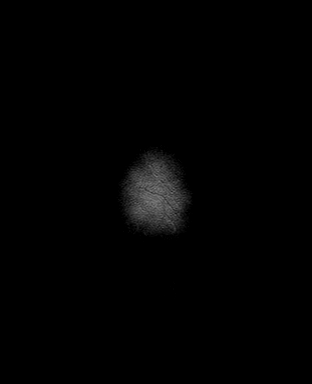

[Series 9: swi_images · axial · 3.0mm · 0.75mm/px · z∈[-72,+102]mm · 4 of 60 slices shown]
[im 1/60]
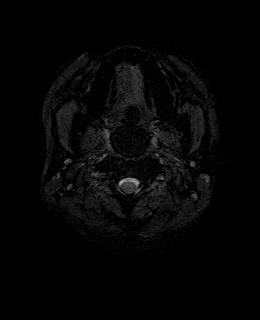
[im 20/60]
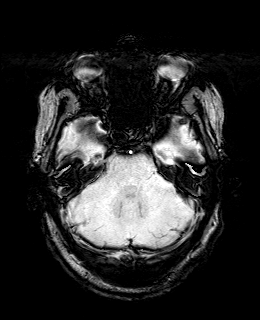
[im 40/60]
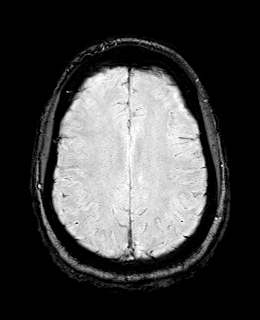
[im 60/60]
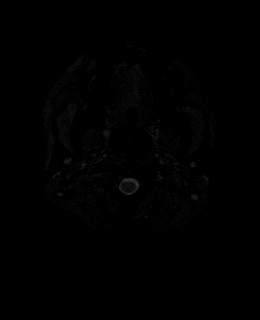

[Series 11: FLAIR · axial · 3.0mm · 0.75mm/px · z∈[-60,+91]mm · 4 of 52 slices shown]
[im 1/52]
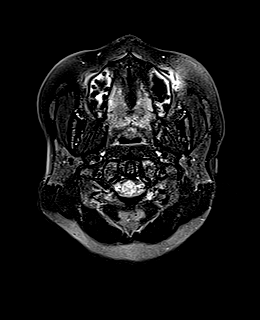
[im 18/52]
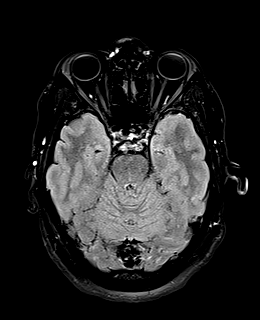
[im 35/52]
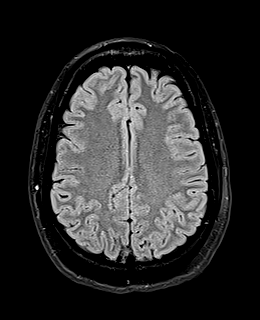
[im 52/52]
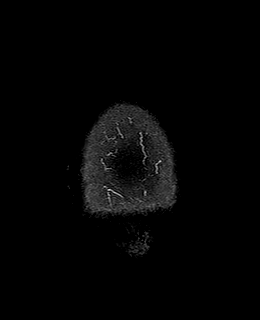

[Series 12: T1 · axial · 1.0mm · 0.94mm/px · z∈[-56,+100]mm · 11 of 160 slices shown (2 of 2)]
[im 1/160]
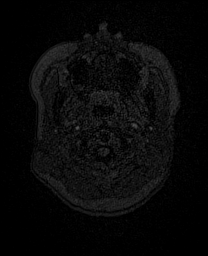
[im 16/160]
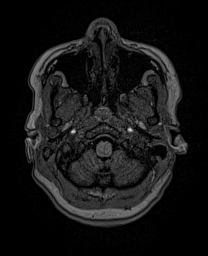
[im 32/160]
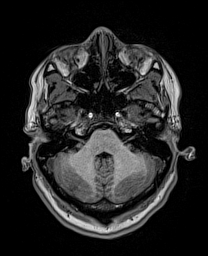
[im 48/160]
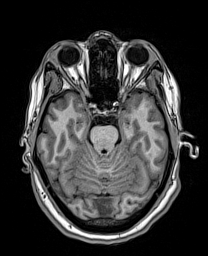
[im 64/160]
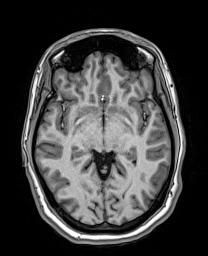
[im 80/160]
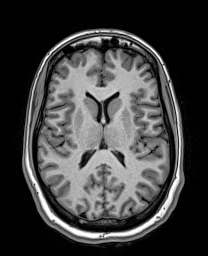
[im 96/160]
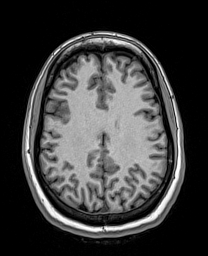
[im 112/160]
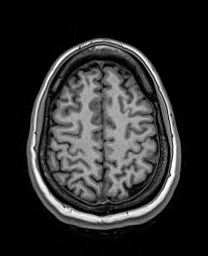
[im 128/160]
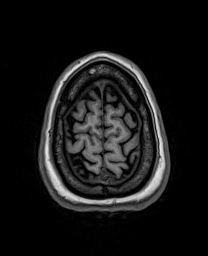
[im 144/160]
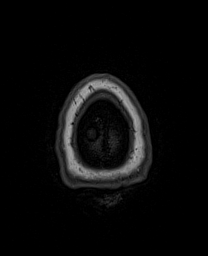
[im 160/160]
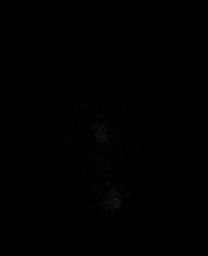

[Series 13: cor dwi_tracew · coronal · 5.0mm · 1.53mm/px · 4 of 60 slices shown]
[im 1/60]
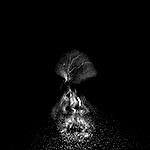
[im 20/60]
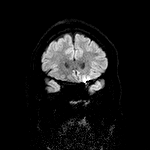
[im 40/60]
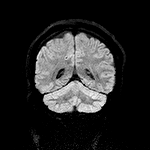
[im 60/60]
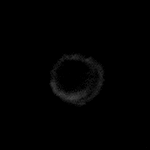

[Series 14: cor dwi_adc · coronal · 5.0mm · 1.53mm/px · 2 of 30 slices shown]
[im 1/30]
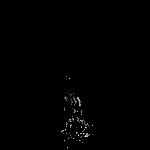
[im 30/30]
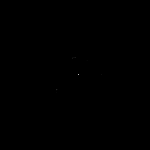

[Series 15: T2 · coronal · 5.0mm · 0.57mm/px · 3 of 38 slices shown (2 of 4)]
[im 1/38]
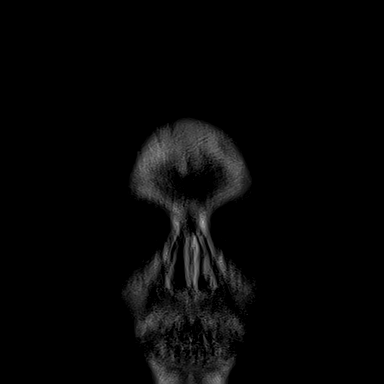
[im 19/38]
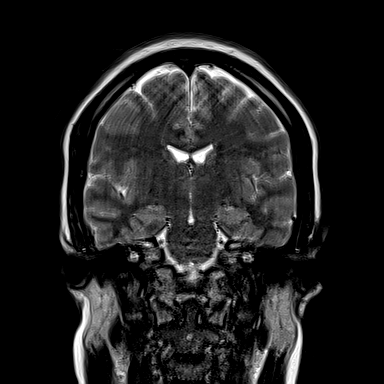
[im 38/38]
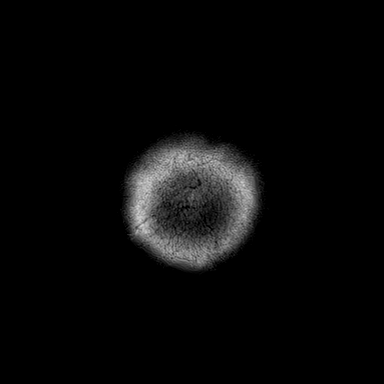

[Series 16: T2 · coronal · 2.0mm · 0.28mm/px · 4 of 64 slices shown (3 of 4)]
[im 1/64]
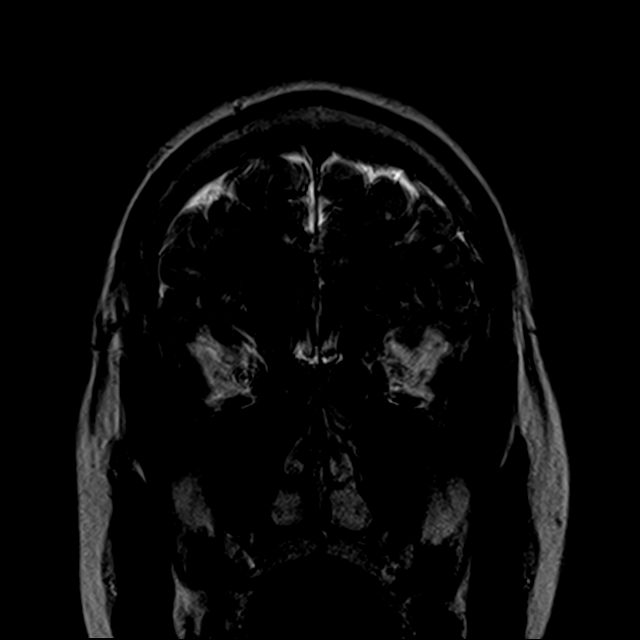
[im 22/64]
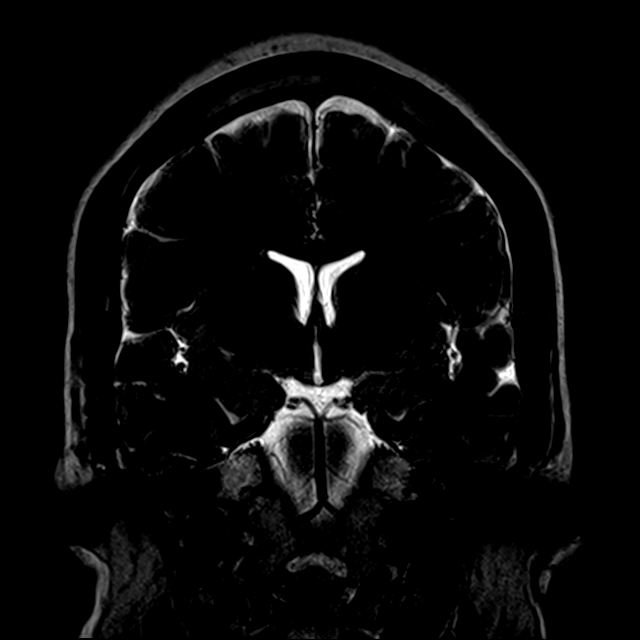
[im 43/64]
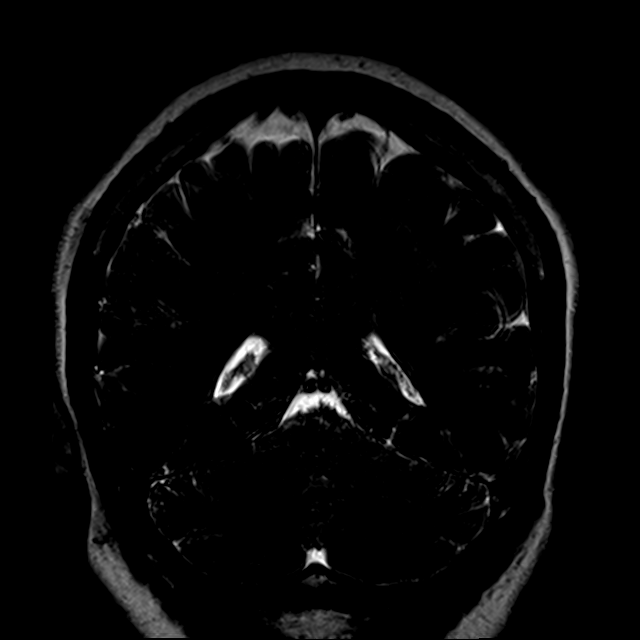
[im 64/64]
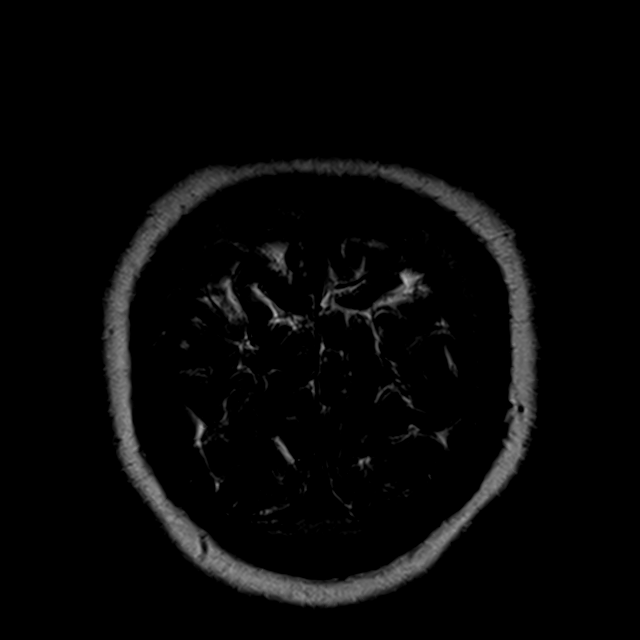

[Series 17: T2 · coronal · 5.0mm · 0.69mm/px · 3 of 38 slices shown (4 of 4)]
[im 1/38]
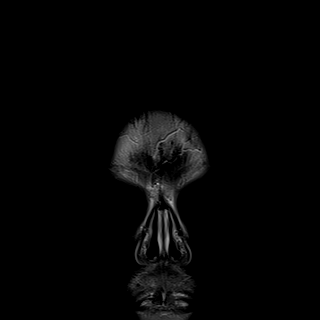
[im 19/38]
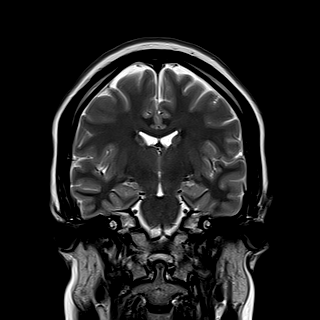
[im 38/38]
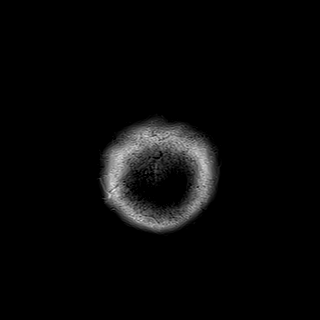

[48 of 48 positions shown; findings below may reference images not displayed]

FINDINGS: Brain: There is no acute intracranial hemorrhage, extra-axial fluid
collection, or infarct.

There is no parenchymal signal abnormality. No mass lesion is
identified. There is no midline shift.

The hippocampal formations are symmetric. There is no structural or
migration abnormality. The corpus callosum is normally formed.

Vascular: Normal flow voids.

Skull and upper cervical spine: Normal marrow signal.

Sinuses/Orbits: The paranasal sinuses are clear. The globes and
orbits are unremarkable.

Other: None.
IMPRESSION: No acute intracranial pathology or epileptogenic focus identified.

## 2020-12-02 NOTE — ED Provider Notes (Signed)
[facility]-EMERGENCY DEPT Provider Note   CSN: [id number] Arrival date & time: 12/02/20  4431     History Chief Complaint  Patient presents with   Loss of Consciousness    Erica [name] is a 25 y.o. female brought to the ED by her spouse, with complaints that the patient is not like her usual self. Yesterday, she had an isolated episode of whole-body "shaking" followed by a fall to the ground. No reported head injury.The shaking was on going for a couple minutes. She was unresponsive to name for several minutes after falling to the ground, and then regained consciousness shortly after, but "was confused for a while afterwards". No reoccurrence noted overnight. This morning, spouse noted that pt had "shaking" again, but not resulting in falls or LOC. Pt c/o of decreased visual acuity and confusion since onset of symptoms yesterday.   Pt denies chest pain, headaches, SOB, abdominal pain,and lightheadedness. Pt denies bladder or bowel incontinence, and intraoral injury.    Loss of Consciousness Associated symptoms: no chest pain, no dizziness, no fever, no palpitations, no shortness of breath and no weakness       Past Medical History:  Diagnosis Date   Medical history non-contributory     Patient Active Problem List   Diagnosis Date Noted   Chlamydial infection 08/16/2014    Past Surgical History:  Procedure Laterality Date   EYE SURGERY       OB History     Gravida  1   Para  1   Term  1   Preterm      AB      Living  1      SAB      IAB      Ectopic      Multiple      Live Births  1           History reviewed. No pertinent family history.  Social History   Tobacco Use   Smoking status: Never   Smokeless tobacco: Never  Vaping Use   Vaping Use: Never used  Substance Use Topics   Alcohol use: No    Alcohol/week: 0.0 standard drinks   Drug use: No    Home Medications Prior to Admission medications    Medication Sig Start Date End Date Taking? Authorizing Provider  levonorgestrel (MIRENA) 20 MCG/24HR IUD 1 each by Intrauterine route once. Administered 07/13/13.   Yes [provider]    Allergies    Patient has no known allergies.  Review of Systems   Review of Systems  Constitutional:  Negative for chills and fever.  Eyes:  Positive for visual disturbance.  Respiratory:  Negative for cough, shortness of breath and wheezing.   Cardiovascular:  Positive for syncope. Negative for chest pain, palpitations and leg swelling.  Gastrointestinal:  Negative for abdominal pain, constipation and diarrhea.  Neurological:  Negative for dizziness, tremors, weakness and light-headedness.  All other systems reviewed and are negative.  Physical Exam Updated Vital Signs BP 101/70   Pulse 69   Temp 97.8 F (36.6 C) (Oral)   Resp 16   Ht 5\' 2"  (1.575 m)   Wt 64.4 kg   SpO2 98%   BMI 25.97 kg/m   Physical Exam Constitutional:      General: She is not in acute distress.    Appearance: Normal appearance. She is not ill-appearing.  HENT:     Head: Normocephalic and atraumatic.     Mouth/Throat:  Comments: No evidence of tongue biting/laceration Eyes:     Extraocular Movements: Extraocular movements intact.  Cardiovascular:     Rate and Rhythm: Normal rate and regular rhythm.     Pulses: Normal pulses.     Heart sounds: Normal heart sounds.  Pulmonary:     Effort: Pulmonary effort is normal.     Breath sounds: Normal breath sounds.  Abdominal:     General: Abdomen is flat.     Palpations: Abdomen is soft.  Musculoskeletal:     Cervical back: Normal range of motion.  Skin:    General: Skin is warm and dry.  Neurological:     General: No focal deficit present.     Mental Status: She is alert and oriented to person, place, and time. Mental status is at baseline.  Psychiatric:        Mood and Affect: Mood normal.        Behavior: Behavior normal.    ED Results /  Procedures / Treatments   Labs (all labs ordered are listed, but only abnormal results are displayed) Labs Reviewed  BASIC METABOLIC PANEL - Abnormal; Notable for the following components:      Result Value   Potassium 3.3 (*)    Chloride 112 (*)    All other components within normal limits  URINALYSIS, ROUTINE W REFLEX MICROSCOPIC - Abnormal; Notable for the following components:   Hgb urine dipstick SMALL (*)    Bilirubin Urine SMALL (*)    Ketones, ur 80 (*)    Bacteria, UA RARE (*)    All other components within normal limits  CBC  RAPID URINE DRUG SCREEN, HOSP PERFORMED  MAGNESIUM  I-STAT BETA HCG BLOOD, ED ([facility], [facility], [facility] ONLY)  CBG MONITORING, ED    EKG EKG Interpretation  Date/Time:  Tuesday December 02 2020 09:02:17 EDT Ventricular Rate:  66 PR Interval:  131 QRS Duration: 97 QT Interval:  434 QTC Calculation: 455 R Axis:   54 Text Interpretation: Sinus rhythm no wpw, prolonged qt or brugada No significant change since last tracing Confirmed by Melene Plan [phone]) on 12/02/2020 11:02:22 AM  Radiology CT HEAD WO CONTRAST ( )  Result Date: 12/02/2020 CLINICAL DATA:  Recent seizure activity with altered mental status, initial encounter EXAM: CT HEAD WITHOUT CONTRAST TECHNIQUE: Contiguous axial images were obtained from the base of the skull through the vertex without intravenous contrast. COMPARISON:  None. FINDINGS: Brain: No evidence of acute infarction, hemorrhage, hydrocephalus, extra-axial collection or mass lesion/mass effect. Bilateral basal ganglia calcifications are noted. Vascular: No hyperdense vessel or unexpected calcification. Skull: Normal. Negative for fracture or focal lesion. Sinuses/Orbits: No acute finding. Other: None. IMPRESSION: No acute intracranial abnormality noted. Electronically Signed   By: Alcide Clever M.D.   On: 12/02/2020 11:10   MR BRAIN WO CONTRAST  Result Date: 12/02/2020 CLINICAL DATA:  Seizure EXAM: MRI HEAD WITHOUT CONTRAST TECHNIQUE:  Multiplanar, multiecho pulse sequences of the brain and surrounding structures were obtained without intravenous contrast. COMPARISON:  None. FINDINGS: Brain: There is no acute intracranial hemorrhage, extra-axial fluid collection, or infarct. There is no parenchymal signal abnormality. No mass lesion is identified. There is no midline shift. The hippocampal formations are symmetric. There is no structural or migration abnormality. The corpus callosum is normally formed. Vascular: Normal flow voids. Skull and upper cervical spine: Normal marrow signal. Sinuses/Orbits: The paranasal sinuses are clear. The globes and orbits are unremarkable. Other: None. IMPRESSION: No acute intracranial pathology or epileptogenic focus identified. Electronically Signed  By: Lesia Hausen M.D.   On: 12/02/2020 15:03   EEG adult  Result Date: 12/02/2020 Charlsie Quest, MD     12/02/2020  4:12 PM Patient Name: Erica [name] MRN: [id number] Epilepsy Attending: Charlsie Quest Referring Physician/Provider: Dr Carmel [place] Date: 12/02/2020 Duration: 23.37 mins Patient history: 25 yo F with new seizure like episodes. Yesterday patient had a normal day, yesterday evening patient was standing in the garage when the wife witnessed her stop moving, began shaking while standing up and then fell to the ground.  Shaking continued for around 1 minute before it resolved, patient seemed somewhat confused afterwards.  Ambulance came out to evaluate the patient, patient returned to normal and was not transported to the ER.  This morning when patient woke up she was in the bathroom, she began shaking again, she did not fall she was standing the whole time.  Denies any incontinence or intraoral injury.  Shaking resolved spontaneously and patient was brought to the ER. EEG to evaluate for seizure. Level of alertness: Awake, asleep AEDs during EEG study: None Technical aspects: This EEG study was done with scalp electrodes positioned according to  the 10-20 International system of electrode placement. Electrical activity was acquired at a sampling rate of 500Hz  and reviewed with a high frequency filter of 70Hz  and a low frequency filter of 1Hz . EEG data were recorded continuously and digitally stored. Description: The posterior dominant rhythm consists of 10-11Hz  activity of moderate voltage (25-35 uV) seen predominantly in posterior head regions, symmetric and reactive to eye opening and eye closing. Sleep was characterized by vertex waves, sleep spindles (12 to 14 Hz), maximal frontocentral region.  Hyperventilation and photic stimulation were not performed.   IMPRESSION: This study is within normal limits. No seizures or epileptiform discharges were seen throughout the recording.    Procedures Procedures   Medications Ordered in ED Medications - No data to display  ED Course  I have reviewed the triage vital signs and the nursing notes.  Pertinent labs & imaging results that were available during my care of the patient were reviewed by me and considered in my medical decision making (see chart for details).    MDM Rules/Calculators/A&P                           Pt presents with whole-body shaking followed by syncopal episode yesterday. Spouse notes that she appears to be shaking today as well though no associated syncope, and is not at her baseline with complaints of confusion and weakness. The pt also complains of blurry vision. Stable vitals. Workup including U/A, UDS, CBG, BMP, and Mag without acute changes. CT head negative for acute intracranial abnormalities. Neurology consulted recommending MRI and EEG. MRI negative for acute intracranial pathology or epileptogenic focus identified, and EEG negative. Pt monitored and reports improvement in her symptoms. Pt discharged with instructions for outpatient follow up with neurology for further work up.    Final Clinical Impression(s) / ED Diagnoses Final diagnoses:   Syncope and collapse    Rx / DC Orders ED Discharge Orders          Ordered    Ambulatory referral to Neurology       Comments: An appointment is requested in approximately: 1 week   12/02/20 1627             , MD 12/02/20 1639    12/04/20, DO 12/02/20  1645  

## 2020-12-02 NOTE — ED Notes (Signed)
Pt ambulatory in triage. 

## 2020-12-02 NOTE — ED Notes (Signed)
Pt ambulatory to the bathroom w/o assistance Pt had no complaints Pt attempting to provide urine sample

## 2020-12-02 NOTE — Discharge Instructions (Addendum)
Erica [name] you came into the ED with complaints of shaking, syncope and weakness. We did a full work up, including a CT, MRI, and EEG, which all came back as normal. Additionally, your labs and vitals were stable. Please continue to stay well hydrated. Someone from [facility] should be in contact with you to schedule an outpatient appointment to further work up and treat these symptoms you experienced.

## 2020-12-02 NOTE — Procedures (Addendum)
Patient Name: JAKYRIA BLEAU  MRN: [id number]  Epilepsy Attending: Charlsie Quest  Referring Physician/Provider: Dr Carmel [place] Date: 12/02/2020 Duration: 23.37 mins  Patient history: 25 yo F with new seizure like episodes. Yesterday patient had a normal day, yesterday evening patient was standing in the garage when the wife witnessed her stop moving, began shaking while standing up and then fell to the ground.  Shaking continued for around 1 minute before it resolved, patient seemed somewhat confused afterwards.  Ambulance came out to evaluate the patient, patient returned to normal and was not transported to the ER.   This morning when patient woke up she was in the bathroom, she began shaking again, she did not fall she was standing the whole time.  Denies any incontinence or intraoral injury.  Shaking resolved spontaneously and patient was brought to the ER.  EEG to evaluate for seizure.   Level of alertness: Awake, asleep  AEDs during EEG study: None  Technical aspects: This EEG study was done with scalp electrodes positioned according to the 10-20 International system of electrode placement. Electrical activity was acquired at a sampling rate of 500Hz  and reviewed with a high frequency filter of 70Hz  and a low frequency filter of 1Hz . EEG data were recorded continuously and digitally stored.   Description: The posterior dominant rhythm consists of 10-11Hz  activity of moderate voltage (25-35 uV) seen predominantly in posterior head regions, symmetric and reactive to eye opening and eye closing. Sleep was characterized by vertex waves, sleep spindles (12 to 14 Hz), maximal frontocentral region.  Hyperventilation and photic stimulation were not performed.     IMPRESSION: This study is within normal limits. No seizures or epileptiform discharges were seen throughout the recording.  Nicolle Heward 

## 2020-12-02 NOTE — ED Notes (Signed)
EEG at bedside.

## 2020-12-02 NOTE — Progress Notes (Signed)
EEG complete - results pending 

## 2020-12-02 NOTE — ED Triage Notes (Signed)
Pt arrived via POV, with spouse. Per spouse, pt was talking last night, fell and started "entire body shaking" per spouse. Pt not responding to verbal stimuli during episode, and confused after. Ambulance called, assessed pt, no transport at that time. Per spouse, pt had another episode this morning, "entire body shaking" while standing. Pt states she does not remember either episode. Answers question appropriately in triage, but slow to answer. No hx of seizures.

## 2020-12-02 NOTE — ED Provider Notes (Addendum)
Emergency Medicine Provider Triage Evaluation Note  Erica [name] , a 25 y.o. female  was evaluated in triage.  Pt complains of shaking.  History obtained by patient and patient's wife to [place].  Yesterday patient had a normal day, yesterday evening patient was standing in the garage when the wife witnessed her stop moving, began shaking while standing up and then fell to the ground.  Shaking continued for around 1 minute before it resolved, patient seemed somewhat confused afterwards.  Ambulance came out to evaluate the patient, patient returned to normal and was not transported to the ER.  This morning when patient woke up she was in the bathroom, she began shaking again, she did not fall she was standing the whole time.  Denies any incontinence or intraoral injury.  Shaking resolved spontaneously and patient was brought to the ER.  Patient's wife reports that she still seems slightly confused.  Review of Systems  Positive: Shaking, confusion Negative: Headache, head injury, neck pain, back pain, chest pain, abdominal pain, nausea/vomiting, drug/alcohol use, history of seizures or any additional concerns  Physical Exam  BP 114/82 (BP Location: Left Arm)   Pulse 72   Temp 97.8 F (36.6 C) (Oral)   Resp 18   SpO2 99%  Gen:   Awake, no distress   Resp:  Normal effort  MSK:   Moves extremities without difficulty  Other:  Cranial nerves intact, alert and oriented x3, negative pronator drift, equal movement of extremities x4  Medical Decision Making  Medically screening exam initiated at 9:33 AM.  Appropriate orders placed.  Erica [name] was informed that the remainder of the evaluation will be completed by another provider, this initial triage assessment does not replace that evaluation, and the importance of remaining in the ED until their evaluation is complete.  Risks versus benefits of CT scanning were discussed with patient and her wife at bedside.  They stated understanding  and consented to CT head.  Of note patient's wife has known the patient for 3 years.  She called the patient's father who reports that patient has never had similar episodes in the past.   Note: Portions of this report may have been transcribed using voice recognition software. Every effort was made to ensure accuracy; however, inadvertent computerized transcription errors may still be present.    Erica Salinas, PA-C 12/02/20 0936    Erica Salinas, PA-C 12/02/20 8786    Erica Avena, MD 12/02/20 1400

## 2020-12-02 NOTE — ED Notes (Signed)
Patient returned from MRI.

## 2020-12-04 DIAGNOSIS — Z8742 Personal history of other diseases of the female genital tract: Secondary | ICD-10-CM | POA: Insufficient documentation

## 2021-08-03 ENCOUNTER — Ambulatory Visit
Admission: EM | Admit: 2021-08-03 | Discharge: 2021-08-03 | Disposition: A | Discharge status: Home / Self Care | Payer: Medicaid Other | Attending: Emergency Medicine | Admitting: Emergency Medicine

## 2021-08-03 DIAGNOSIS — N611 Abscess of the breast and nipple: Secondary | ICD-10-CM | POA: Diagnosis not present

## 2021-08-03 MED ORDER — DOXYCYCLINE HYCLATE 100 MG PO CAPS: 100.0000 mg | ORAL_CAPSULE | Freq: Every day | ORAL | 0 refills | Status: AC

## 2021-08-03 NOTE — ED Provider Notes (Signed)
?[facility] ? ? ? ?CSN: [id number] ?Arrival date & time: 08/03/21  0816 ?  ? ?HISTORY  ? ?Chief Complaint  ?Patient presents with  ? Abscess  ? ?HPI ?Erica [name] is a 26 y.o. female. Patient complains of a lesion under her right breast that is new.  Patient states she never has similar lesion in the past.  Patient states initially the lesion burst and drained pus but now was coming out as bloody.  Patient states it looks like it has a hole in it.  Patient states that the lesion initially got better but then came back a few weeks after she popped it. ? ?The history is provided by the patient.  ?Past Medical History:  ?Diagnosis Date  ? Medical history non-contributory   ? ?Patient Active Problem List  ? Diagnosis Date Noted  ? Chlamydial infection 08/16/2014  ? ?Past Surgical History:  ?Procedure Laterality Date  ? EYE SURGERY    ? ?OB History   ? ? Gravida  ?1  ? Para  ?1  ? Term  ?1  ? Preterm  ?   ? AB  ?   ? Living  ?1  ?  ? ? SAB  ?   ? IAB  ?   ? Ectopic  ?   ? Multiple  ?   ? Live Births  ?1  ?   ?  ?  ? ?Home Medications   ? ?Prior to Admission medications   ?Medication Sig Start Date End Date Taking? Authorizing Provider  ?levonorgestrel (MIRENA) 20 MCG/24HR IUD 1 each by Intrauterine route once. Administered 07/13/13.    [provider]  ? ? ?Family History ?History reviewed. No pertinent family history. ?Social History ?Social History  ? ?Tobacco Use  ? Smoking status: Never  ? Smokeless tobacco: Never  ?Vaping Use  ? Vaping Use: Never used  ?Substance Use Topics  ? Alcohol use: No  ?  Alcohol/week: 0.0 standard drinks  ? Drug use: No  ? ?Allergies   ?Patient has no known allergies. ? ?Review of Systems ?Review of Systems ?Pertinent findings noted in history of present illness.  ? ?Physical Exam ?Triage Vital Signs ?ED Triage Vitals  ?Enc Vitals Group  ?   BP 01/16/21 0827 (!) 147/82  ?   Pulse Rate 01/16/21 0827 72  ?   Resp 01/16/21 0827 18  ?   Temp 01/16/21 0827 98.3 ?F (36.8  ?C)  ?   Temp Source 01/16/21 0827 Oral  ?   SpO2 01/16/21 0827 98 %  ?   Weight --   ?   Height --   ?   Head Circumference --   ?   Peak Flow --   ?   Pain Score 01/16/21 0826 5  ?   Pain Loc --   ?   Pain Edu? --   ?   Excl. in GC? --   ?No data found. ? ?Updated Vital Signs ?BP 111/80 (BP Location: Right Arm)   Pulse 65   Temp 98.3 ?F (36.8 ?C) (Oral)   Resp 18   SpO2 97%  ? ?Physical Exam ?Chest:  ?Breasts: ?   Tanner Score is 5.  ?   Right: Skin change present.  ? ? ?   Comments: Lesion beneath right breast and inframammary fold concerning for hidradenitis suppurativa.  No other scarring appreciated in the same area. ? ? ?Visual Acuity ?Right Eye Distance:   ?Left Eye Distance:   ?  Bilateral Distance:   ? ?Right Eye Near:   ?Left Eye Near:    ?Bilateral Near:    ? ?UC Couse / Diagnostics / Procedures:  ?  ?EKG ? ?Radiology ?No results found. ? ?Procedures ?Procedures (including critical care time) ? ?UC Diagnoses / Final Clinical Impressions(s)   ?I have reviewed the triage vital signs and the nursing notes. ? ?Pertinent labs & imaging results that were available during my care of the patient were reviewed by me and considered in my medical decision making (see chart for details).   ? ?Final diagnoses:  ?Intramammary breast abscess  ? ?Patient provided with a 14-day course of doxycycline and advised to follow-up with her primary care provider.  Patient provided with educational information regarding hidradenitis suppurativa.  Return precautions advised. ? ?ED Prescriptions   ? ? Medication Sig Dispense Auth. Provider  ? doxycycline (VIBRAMYCIN) 100 MG capsule Take 1 capsule (100 mg total) by mouth daily for 14 days. 14 capsule Theadora RamaMorgan, Lanny Lipkin Scales, PA-C  ? ?  ? ?PDMP not reviewed this encounter. ? ?Pending results:  ?Labs Reviewed - No data to display ? ?Medications Ordered in UC: ?Medications - No data to display ? ?Disposition Upon Discharge:  ?Condition: stable for discharge home ?Home: take  medications as prescribed; routine discharge instructions as discussed; follow up as advised. ? ?Patient presented with an acute illness with associated systemic symptoms and significant discomfort requiring urgent management. In my opinion, this is a condition that a prudent lay person (someone who possesses an average knowledge of health and medicine) may potentially expect to result in complications if not addressed urgently such as respiratory distress, impairment of bodily function or dysfunction of bodily organs.  ? ?Routine symptom specific, illness specific and/or disease specific instructions were discussed with the patient and/or caregiver at length.  ? ?As such, the patient has been evaluated and assessed, work-up was performed and treatment was provided in alignment with urgent care protocols and evidence based medicine.  Patient/parent/caregiver has been advised that the patient may require follow up for further testing and treatment if the symptoms continue in spite of treatment, as clinically indicated and appropriate. ? ?Patient/parent/caregiver has been advised to [facility] or [facility]CP if no better; to PCP or the Emergency Department if new signs and symptoms develop, or if the current signs or symptoms continue to change or worsen for further workup, evaluation and treatment as clinically indicated and appropriate ? ?The patient will follow up with their current PCP if and as advised. If the patient does not currently have a PCP we will assist them in obtaining one.  ? ?The patient may need specialty follow up if the symptoms continue, in spite of conservative treatment and management, for further workup, evaluation, consultation and treatment as clinically indicated and appropriate. ? ? ?Patient/parent/caregiver verbalized understanding and agreement of plan as discussed.  All questions were addressed during visit.  Please see discharge instructions below for further details of  plan. ? ?Discharge Instructions: ? ? ?Discharge Instructions   ? ?  ?Unfortunately, the antibiotic tablets that I wanted to prescribe for you are not covered by your insurance.  Instead they cover the same medication which comes in a capsule form.  If you are unable to swallow the capsule whole, please feel free to open the capsule and sprinkle it onto a spoonful of applesauce.  Please do not take this medication with any dairy products because it will bind with calcium and your body will  not be able to absorb it. ? ?As we discussed, please read the enclosed information about hidradenitis suppurativa.  Please follow-up with your primary care provider if the lesion recurs in the same spot or you can get a similar lesion beneath your breast, under your arms or around your groin area. ? ? ? ? ? ?This office note has been dictated using Teaching laboratory technician.  Unfortunately, and despite my best efforts, this method of dictation can sometimes lead to occasional typographical or grammatical errors.  I apologize in advance if this occurs.  ? ?  ?Theadora Rama Scales, PA-C ?08/03/21 0930 ? ?

## 2021-08-03 NOTE — ED Triage Notes (Signed)
Pt c/o ruptured abscess under her right breast. Patient states the drainage is bloody. Patient states the abscess goes away and comes back (for a few weeks).  ?

## 2021-08-03 NOTE — Discharge Instructions (Signed)
Unfortunately, the antibiotic tablets that I wanted to prescribe for you are not covered by your insurance.  Instead they cover the same medication which comes in a capsule form.  If you are unable to swallow the capsule whole, please feel free to open the capsule and sprinkle it onto a spoonful of applesauce.  Please do not take this medication with any dairy products because it will bind with calcium and your body will not be able to absorb it. ? ?As we discussed, please read the enclosed information about hidradenitis suppurativa.  Please follow-up with your primary care provider if the lesion recurs in the same spot or you can get a similar lesion beneath your breast, under your arms or around your groin area. ?

## 2021-10-22 ENCOUNTER — Ambulatory Visit
Admission: RE | Admit: 2021-10-22 | Discharge: 2021-10-22 | Disposition: A | Discharge status: Home / Self Care | Payer: Medicaid Other | Source: Ambulatory Visit | Attending: Physician Assistant | Admitting: Physician Assistant

## 2021-10-22 ENCOUNTER — Telehealth: Payer: Self-pay

## 2021-10-22 ENCOUNTER — Ambulatory Visit (INDEPENDENT_AMBULATORY_CARE_PROVIDER_SITE_OTHER): Payer: Medicaid Other

## 2021-10-22 VITALS — BP 112/77 | HR 76 | Temp 97.9°F | Resp 16

## 2021-10-22 DIAGNOSIS — M545 Low back pain, unspecified: Secondary | ICD-10-CM

## 2021-10-22 DIAGNOSIS — M5440 Lumbago with sciatica, unspecified side: Secondary | ICD-10-CM | POA: Diagnosis not present

## 2021-10-22 MED ORDER — NAPROXEN 500 MG PO TABS: 500.0000 mg | ORAL_TABLET | Freq: Two times a day (BID) | ORAL | 0 refills | Status: DC

## 2021-10-22 MED ORDER — METHOCARBAMOL 500 MG PO TABS: 500.0000 mg | ORAL_TABLET | Freq: Two times a day (BID) | ORAL | 0 refills | Status: DC

## 2021-10-22 NOTE — Discharge Instructions (Signed)
Your x-ray was normal.  Please start Naprosyn twice daily.  Do not take NSAIDs with this medication due to risk of GI bleeding.  Take methocarbamol up to twice a day as needed.  This will make you sleepy so do not drive or drink alcohol with taking it.  Use heat, rest, stretch for additional symptom relief.  I do think you should follow-up with a sports medicine provider as you may benefit from physical therapy or other interventions.  Please call to schedule an appointment.  If you have any worsening symptoms including severe pain, lower extremity weakness, numbness or tingling in your legs, difficulty walking, going to the bathroom yourself without noticing it you need to go to the emergency room.

## 2021-10-22 NOTE — Telephone Encounter (Signed)
Attempt to return patient call about switching her prescribed medications (prescribed today) to a liquid form but there was no answer, voicemail left.
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
## 2022-03-18 ENCOUNTER — Ambulatory Visit
Admission: EM | Admit: 2022-03-18 | Discharge: 2022-03-18 | Disposition: A | Discharge status: Home / Self Care | Payer: Medicaid Other | Attending: Family Medicine | Admitting: Family Medicine

## 2022-03-18 ENCOUNTER — Encounter: Payer: Self-pay | Admitting: Emergency Medicine

## 2022-03-18 ENCOUNTER — Other Ambulatory Visit: Payer: Self-pay

## 2022-03-18 DIAGNOSIS — Z1152 Encounter for screening for COVID-19: Secondary | ICD-10-CM | POA: Diagnosis not present

## 2022-03-18 DIAGNOSIS — J111 Influenza due to unidentified influenza virus with other respiratory manifestations: Secondary | ICD-10-CM

## 2022-03-18 DIAGNOSIS — J069 Acute upper respiratory infection, unspecified: Secondary | ICD-10-CM | POA: Insufficient documentation

## 2022-03-18 MED ORDER — ACETAMINOPHEN 325 MG PO TABS: 650.0000 mg | ORAL_TABLET | Freq: Once | ORAL | Status: DC

## 2022-03-18 MED ORDER — ACETAMINOPHEN 160 MG/5ML PO SOLN
650.0000 mg | Freq: Once | ORAL | Status: AC
  Administered 2022-03-18: 650 mg via ORAL

## 2022-03-18 MED ORDER — OSELTAMIVIR PHOSPHATE 6 MG/ML PO SUSR: 75.0000 mg | Freq: Two times a day (BID) | ORAL | 0 refills | Status: AC

## 2022-03-18 NOTE — ED Triage Notes (Signed)
Pt here for body aches and chills x 2 days

## 2022-03-18 NOTE — ED Provider Notes (Signed)
[facility]    CSN: [id number] Arrival date & time: 03/18/22  9449      History   Chief Complaint Chief Complaint  Patient presents with   Generalized Body Aches    HPI Erica [name] is a 26 y.o. female.   HPI Here for fever and chills, myalgia, and malaise.  She is also had some cough.  Symptoms began yesterday.  No nasal drainage or congestion or sore throat noted.  No nausea or vomiting or diarrhea.  She has not been having periods and she has Mirena IUD in place.  Has a hard time taking pills and usually needs to take liquid medicine   Past Medical History:  Diagnosis Date   Medical history non-contributory     Patient Active Problem List   Diagnosis Date Noted   Chlamydial infection 08/16/2014    Past Surgical History:  Procedure Laterality Date   EYE SURGERY      OB History     Gravida  1   Para  1   Term  1   Preterm      AB      Living  1      SAB      IAB      Ectopic      Multiple      Live Births  1            Home Medications    Prior to Admission medications   Medication Sig Start Date End Date Taking? Authorizing Provider  oseltamivir (TAMIFLU) 6 MG/ML SUSR suspension Take 12.5 mLs (75 mg total) by mouth 2 (two) times daily for 5 days. 03/18/22 03/23/22 Yes Zenia Resides, MD  levonorgestrel (MIRENA) 20 MCG/24HR IUD 1 each by Intrauterine route once. Administered 07/13/13.    [provider]    Family History History reviewed. No pertinent family history.  Social History Social History   Tobacco Use   Smoking status: Never   Smokeless tobacco: Never  Vaping Use   Vaping Use: Never used  Substance Use Topics   Alcohol use: No    Alcohol/week: 0.0 standard drinks of alcohol   Drug use: No     Allergies   Patient has no known allergies.   Review of Systems Review of Systems   Physical Exam Triage Vital Signs ED Triage Vitals [03/18/22 0906]  Enc Vitals Group     BP  126/81     Pulse Rate 100     Resp 18     Temp (!) 100.6 F (38.1 C)     Temp Source Oral     SpO2 96 %     Weight      Height      Head Circumference      Peak Flow      Pain Score 5     Pain Loc      Pain Edu?      Excl. in GC?    No data found.  Updated Vital Signs BP 126/81 (BP Location: Left Arm)   Pulse 100   Temp (!) 100.6 F (38.1 C) (Oral)   Resp 18   SpO2 96%   Visual Acuity Right Eye Distance:   Left Eye Distance:   Bilateral Distance:    Right Eye Near:   Left Eye Near:    Bilateral Near:     Physical Exam Vitals reviewed.  Constitutional:      General: She is not in acute  distress.    Appearance: She is not ill-appearing, toxic-appearing or diaphoretic.  HENT:     Right Ear: Tympanic membrane and ear canal normal.     Left Ear: Tympanic membrane and ear canal normal.     Nose: Nose normal.     Mouth/Throat:     Mouth: Mucous membranes are moist.     Pharynx: No oropharyngeal exudate or posterior oropharyngeal erythema.  Eyes:     Extraocular Movements: Extraocular movements intact.     Conjunctiva/sclera: Conjunctivae normal.     Pupils: Pupils are equal, round, and reactive to light.  Cardiovascular:     Rate and Rhythm: Normal rate and regular rhythm.     Heart sounds: No murmur heard. Pulmonary:     Effort: Pulmonary effort is normal. No respiratory distress.     Breath sounds: No stridor. No wheezing, rhonchi or rales.  Musculoskeletal:     Cervical back: Neck supple.  Lymphadenopathy:     Cervical: No cervical adenopathy.  Skin:    Capillary Refill: Capillary refill takes less than 2 seconds.     Coloration: Skin is not jaundiced or pale.  Neurological:     General: No focal deficit present.     Mental Status: She is alert and oriented to person, place, and time.  Psychiatric:        Behavior: Behavior normal.      UC Treatments / Results  Labs (all labs ordered are listed, but only abnormal results are displayed) Labs  Reviewed  SARS CORONAVIRUS 2 (TAT 6-24 HRS)    EKG   Radiology No results found.  Procedures Procedures (including critical care time)  Medications Ordered in UC Medications  acetaminophen (TYLENOL) 160 MG/5ML solution 650 mg (650 mg Oral Given 03/18/22 0911)    Initial Impression / Assessment and Plan / UC Course  I have reviewed the triage vital signs and the nursing notes.  Pertinent labs & imaging results that were available during my care of the patient were reviewed by me and considered in my medical decision making (see chart for details).        Going to treat empirically for the flu.  We have a shortage in the urgent care clinics of flu PCR test.  COVID swab is done and if positive then we can have her stop the Tamiflu.  If she is positive for COVID she will know if she needs to quarantine. Final Clinical Impressions(s) / UC Diagnoses   Final diagnoses:  Influenza-like illness  Viral URI     Discharge Instructions      Oseltamivir 6 mg/mL suspension--your dose is 12.5 mL (75 mg) by mouth 2 times daily for 5 days   You have been swabbed for COVID, and the test will result in the next 24 hours. Our staff will call you if positive. If the COVID test is positive, you should quarantine for 5 days from the start of your symptoms      ED Prescriptions     Medication Sig Dispense Auth. Provider   oseltamivir (TAMIFLU) 6 MG/ML SUSR suspension Take 12.5 mLs (75 mg total) by mouth 2 (two) times daily for 5 days. 125 mL Zenia Resides, MD      PDMP not reviewed this encounter.   Zenia Resides, MD 03/18/22 [phone]

## 2022-03-18 NOTE — Discharge Instructions (Signed)
Oseltamivir 6 mg/mL suspension--your dose is 12.5 mL (75 mg) by mouth 2 times daily for 5 days   You have been swabbed for COVID, and the test will result in the next 24 hours. Our staff will call you if positive. If the COVID test is positive, you should quarantine for 5 days from the start of your symptoms

## 2022-03-19 LAB — SARS CORONAVIRUS 2 (TAT 6-24 HRS): SARS Coronavirus 2: NEGATIVE

## 2022-03-21 ENCOUNTER — Encounter (HOSPITAL_COMMUNITY): Payer: Self-pay | Admitting: Emergency Medicine

## 2022-03-21 ENCOUNTER — Other Ambulatory Visit: Payer: Self-pay

## 2022-03-21 ENCOUNTER — Emergency Department (HOSPITAL_COMMUNITY)
Admission: EM | Admit: 2022-03-21 | Discharge: 2022-03-21 | Disposition: A | Discharge status: Home / Self Care | Payer: Medicaid Other | Attending: Emergency Medicine | Admitting: Emergency Medicine

## 2022-03-21 DIAGNOSIS — Z1152 Encounter for screening for COVID-19: Secondary | ICD-10-CM | POA: Insufficient documentation

## 2022-03-21 DIAGNOSIS — R0981 Nasal congestion: Secondary | ICD-10-CM | POA: Diagnosis present

## 2022-03-21 DIAGNOSIS — H9209 Otalgia, unspecified ear: Secondary | ICD-10-CM | POA: Insufficient documentation

## 2022-03-21 DIAGNOSIS — J101 Influenza due to other identified influenza virus with other respiratory manifestations: Secondary | ICD-10-CM | POA: Diagnosis not present

## 2022-03-21 LAB — RESP PANEL BY RT-PCR (RSV, FLU A&B, COVID)  RVPGX2
Influenza A by PCR: NEGATIVE
Influenza B by PCR: POSITIVE — AB
Resp Syncytial Virus by PCR: NEGATIVE
SARS Coronavirus 2 by RT PCR: NEGATIVE

## 2022-03-21 NOTE — ED Triage Notes (Signed)
Patient coming to ED for evaluation of flu-like symptoms.  Reports ear pain, body aches, congestion, and fever.  Was seen [facility] and tested negative for COVID.  Pt reports no improvement in symptoms

## 2022-03-21 NOTE — ED Provider Notes (Signed)
[facility]-EMERGENCY DEPT Provider Note   CSN: [id number] Arrival date & time: 03/21/22  1844     History  Chief Complaint  Patient presents with  . Nasal Congestion  . Otalgia    ARIEAL CUOCO is a 26 y.o. female.   Otalgia Associated symptoms: fever      26 year old female presenting to the emergency department with influenza-like illness.  She has had generalized bodyaches, nasal congestion, low-grade fevers.  She was seen in urgent care on Thursday and tested negative for COVID but was not tested for influenza.  She was diagnosed with an influenza-like illness and was started on Tamiflu which she has been taking twice a day.  She endorses continued symptoms.  She denies any chest pain, shortness of breath, sore throat.  Home Medications Prior to Admission medications   Medication Sig Start Date End Date Taking? Authorizing Provider  levonorgestrel (MIRENA) 20 MCG/24HR IUD 1 each by Intrauterine route once. Administered 07/13/13.    [provider]  oseltamivir (TAMIFLU) 6 MG/ML SUSR suspension Take 12.5 mLs (75 mg total) by mouth 2 (two) times daily for 5 days. 03/18/22 03/23/22  Zenia Resides, MD      Allergies    Patient has no known allergies.    Review of Systems   Review of Systems  Constitutional:  Positive for chills and fever.  Musculoskeletal:  Positive for myalgias.  All other systems reviewed and are negative.   Physical Exam Updated Vital Signs BP (!) 121/91 (BP Location: Right Arm)   Pulse 90   Temp 99.9 F (37.7 C) (Oral)   Resp 17   SpO2 93%  Physical Exam Vitals and nursing note reviewed.  Constitutional:      General: She is not in acute distress.    Appearance: She is well-developed.  HENT:     Head: Normocephalic and atraumatic.     Right Ear: Tympanic membrane normal.     Left Ear: Tympanic membrane normal.  Eyes:     Conjunctiva/sclera: Conjunctivae normal.  Cardiovascular:     Rate and  Rhythm: Normal rate and regular rhythm.  Pulmonary:     Effort: Pulmonary effort is normal. No respiratory distress.     Breath sounds: Normal breath sounds.  Abdominal:     Palpations: Abdomen is soft.     Tenderness: There is no abdominal tenderness.  Musculoskeletal:        General: No swelling.     Cervical back: Neck supple.  Skin:    General: Skin is warm and dry.     Capillary Refill: Capillary refill takes less than 2 seconds.  Neurological:     Mental Status: She is alert.  Psychiatric:        Mood and Affect: Mood normal.     ED Results / Procedures / Treatments   Labs (all labs ordered are listed, but only abnormal results are displayed) Labs Reviewed  RESP PANEL BY RT-PCR (RSV, FLU A&B, COVID)  RVPGX2 - Abnormal; Notable for the following components:      Result Value   Influenza B by PCR POSITIVE (*)    All other components within normal limits    EKG None  Radiology No results found.  Procedures Procedures    Medications Ordered in ED Medications - No data to display  ED Course/ Medical Decision Making/ A&P Clinical Course as of 03/21/22 2133  Sun Mar 21, 2022  2133 Influenza B By PCR(!): POSITIVE [JL]  Clinical Course User Index [JL] Ernie Avena, MD                           Medical Decision Making   26 year old female presenting to the emergency department with influenza-like illness.  She has had generalized bodyaches, nasal congestion, low-grade fevers.  She was seen in urgent care on Thursday and tested negative for COVID but was not tested for influenza.  She was diagnosed with an influenza-like illness and was started on Tamiflu which she has been taking twice a day.  She endorses continued symptoms.  She denies any chest pain, shortness of breath, sore throat.  MINERVA BLUETT is a 26 y.o. female who presents to the ED with a 5 day history of fever, rhinorrhea, and nasal congestion.  On my exam, the patient is well-appearing and  well-hydrated.  The patient's lungs are clear to auscultation bilaterally. Additionally, the patient has a soft/non-tender abdomen, clear tympanic membranes, and no oropharyngeal exudates.  There are no signs of meningismus.  I see no signs of an acute bacterial infection.  The patient's presentation is most consistent with a viral upper respiratory infection.  I have a low suspicion for pneumonia as the patient's cough has been non-productive and the patient is neither tachypneic nor hypoxic on room air.  Additionally, the patient is CTAB.   Influenza testing was obtained. The patient is positive for influenza. She is already on Tamiflu.  Discussed return precautions in the event of worsening symptoms which would require chest x-ray imaging to evaluate for possible superimposed bacterial pneumonia.  I discussed symptomatic management, including hydration, motrin, and tylenol. The patient/family felt safe being discharged from the ED.  They agreed to followup with the PCP if needed.  I provided ED return precautions.  Final Clinical Impression(s) / ED Diagnoses Final diagnoses:  Influenza B    Rx / DC Orders ED Discharge Orders     None         Ernie Avena, MD 03/21/22 2133

## 2022-03-21 NOTE — Discharge Instructions (Signed)
Recommend continued oral hydration with electrolyte containing solutions like Gatorade and Pedialyte.  Utilize Tylenol and ibuprofen for muscle aches and fever.  If for any reason you are not improving by day 7 and in fact are worsening, you could return for chest x-ray to evaluate for possible superimposed developing bacterial pneumonia.  Otherwise symptoms should start to improve by 7 to 10 days.

## 2022-03-21 NOTE — ED Provider Triage Note (Signed)
Emergency Medicine Provider Triage Evaluation Note  Erica [name] , a 26 y.o. female  was evaluated in triage.  Pt complains of bodyaches, chills, fever since Wednesday.  Patient states she was seen in urgent care on Thursday and had a negative COVID test.  Urgent care was unable to do flu testing due to shortages of swabs.  She was started on Tamiflu based on symptoms.  Patient endorses continued fever, chills, body aches.  Patient denies shortness of breath, sore throat  Review of Systems  Positive: As above Negative: As above  Physical Exam  BP (!) 121/91 (BP Location: Right Arm)   Pulse 90   Temp 99.9 F (37.7 C) (Oral)   Resp 17   SpO2 93%  Gen:   Awake, no distress   Resp:  Normal effort  MSK:   Moves extremities without difficulty  Other:    Medical Decision Making  Medically screening exam initiated at 8:03 PM.  Appropriate orders placed.  [name] was informed that the remainder of the evaluation will be completed by another provider, this initial triage assessment does not replace that evaluation, and the importance of remaining in the ED until their evaluation is complete.     Darrick Grinder, PA-C 03/21/22 2005

## 2022-07-15 ENCOUNTER — Ambulatory Visit
Admission: RE | Admit: 2022-07-15 | Discharge: 2022-07-15 | Disposition: A | Discharge status: Home / Self Care | Payer: Medicaid Other | Source: Ambulatory Visit | Attending: Nurse Practitioner | Admitting: Nurse Practitioner

## 2022-07-15 VITALS — BP 135/94 | HR 69 | Temp 99.0°F | Resp 18

## 2022-07-15 DIAGNOSIS — M545 Low back pain, unspecified: Secondary | ICD-10-CM

## 2022-07-15 MED ORDER — LIDOCAINE 5 % EX PTCH: 1.0000 | MEDICATED_PATCH | CUTANEOUS | 0 refills | Status: DC

## 2022-07-15 MED ORDER — CYCLOBENZAPRINE HCL 10 MG PO TABS: 10.0000 mg | ORAL_TABLET | Freq: Two times a day (BID) | ORAL | 0 refills | Status: DC | PRN

## 2022-07-15 MED ORDER — KETOROLAC TROMETHAMINE 30 MG/ML IJ SOLN
30.0000 mg | Freq: Once | INTRAMUSCULAR | Status: AC
  Administered 2022-07-15: 30 mg via INTRAMUSCULAR

## 2022-07-15 NOTE — ED Triage Notes (Signed)
Pt c/o lower back pain for past 2 wks. Denies injury or pain radiating. Denies taking any meds.

## 2022-07-15 NOTE — ED Provider Notes (Signed)
[facility]    CSN: [id number] Arrival date & time: 07/15/22  1541      History   Chief Complaint Chief Complaint  Patient presents with   Back Pain    Lower back pain feels like sharp pain barely being able to move or get up hurts bending over - Entered by patient    HPI Erica [name] is a 27 y.o. female presents for evaluation of low back pain.  Patient reports 2 weeks ago she developed bilateral low back pain that does not radiate.  States is consistent with waxing and waning intensity.  Describes it as an aching pain.  Denies any numbness/tingling/weakness of her lower extremities, no bowel or bladder incontinence, no saddle paresthesia.  Denies any known injury or inciting event.  Denies any history of back surgeries or injuries in the past.  She has not taken any OTC medications for her symptoms since onset.  No other concerns at this time.   Back Pain   Past Medical History:  Diagnosis Date   Medical history non-contributory     Patient Active Problem List   Diagnosis Date Noted   Chlamydial infection 08/16/2014    Past Surgical History:  Procedure Laterality Date   EYE SURGERY      OB History     Gravida  1   Para  1   Term  1   Preterm      AB      Living  1      SAB      IAB      Ectopic      Multiple      Live Births  1            Home Medications    Prior to Admission medications   Medication Sig Start Date End Date Taking? Authorizing Provider  cyclobenzaprine (FLEXERIL) 10 MG tablet Take 1 tablet (10 mg total) by mouth 2 (two) times daily as needed for muscle spasms. 07/15/22  Yes Radford Pax, NP  lidocaine (LIDODERM) 5 % Place 1 patch onto the skin daily. Remove & Discard patch within 12 hours or as directed by MD 07/15/22  Yes Radford Pax, NP  levonorgestrel (MIRENA) 20 MCG/24HR IUD 1 each by Intrauterine route once. Administered 07/13/13.    [provider]    Family History Family History   Problem Relation Age of Onset   Healthy Mother     Social History Social History   Tobacco Use   Smoking status: Never   Smokeless tobacco: Never  Vaping Use   Vaping Use: Never used  Substance Use Topics   Alcohol use: No    Alcohol/week: 0.0 standard drinks of alcohol   Drug use: No     Allergies   Patient has no known allergies.   Review of Systems Review of Systems  Musculoskeletal:  Positive for back pain.     Physical Exam Triage Vital Signs ED Triage Vitals [07/15/22 1616]  Enc Vitals Group     BP (!) 135/94     Pulse Rate 69     Resp 18     Temp 99 F (37.2 C)     Temp Source Oral     SpO2 98 %     Weight      Height      Head Circumference      Peak Flow      Pain Score 8     Pain  Loc      Pain Edu?      Excl. in GC?    No data found.  Updated Vital Signs BP (!) 135/94 (BP Location: Left Arm)   Pulse 69   Temp 99 F (37.2 C) (Oral)   Resp 18   SpO2 98%   Breastfeeding No   Visual Acuity Right Eye Distance:   Left Eye Distance:   Bilateral Distance:    Right Eye Near:   Left Eye Near:    Bilateral Near:     Physical Exam Vitals and nursing note reviewed.  Constitutional:      General: She is not in acute distress.    Appearance: Normal appearance. She is not ill-appearing.  HENT:     Head: Normocephalic and atraumatic.  Eyes:     Pupils: Pupils are equal, round, and reactive to light.  Cardiovascular:     Rate and Rhythm: Normal rate.  Pulmonary:     Effort: Pulmonary effort is normal.  Musculoskeletal:     Cervical back: Normal.     Thoracic back: Normal.     Lumbar back: Tenderness present. No swelling, edema, deformity, signs of trauma, lacerations, spasms or bony tenderness. Normal range of motion. Negative right straight leg raise test and negative left straight leg raise test. No scoliosis.     Comments: Strength is 5 out of 5 bilateral lower extremities  Skin:    General: Skin is warm and dry.  Neurological:      General: No focal deficit present.     Mental Status: She is alert and oriented to person, place, and time.     Deep Tendon Reflexes:     Reflex Scores:      Patellar reflexes are 2+ on the right side and 2+ on the left side. Psychiatric:        Mood and Affect: Mood normal.        Behavior: Behavior normal.      UC Treatments / Results  Labs (all labs ordered are listed, but only abnormal results are displayed) Labs Reviewed - No data to display  EKG   Radiology No results found.  Procedures Procedures (including critical care time)  Medications Ordered in UC Medications  ketorolac (TORADOL) 30 MG/ML injection 30 mg (has no administration in time range)    Initial Impression / Assessment and Plan / UC Course  I have reviewed the triage vital signs and the nursing notes.  Pertinent labs & imaging results that were available during my care of the patient were reviewed by me and considered in my medical decision making (see chart for details).    Reviewed recent provider notes for patient.  Also reviewed recent pertinent labs.   Patient given Toradol injection in clinic.  Monitored for 10 minutes after injection with no reaction noted and tolerated well.  Instructed no NSAIDs for 24 hours and verbalized understanding Trial of Flexeril.  Side effect profile reviewed Lidoderm patch as needed OTC ibuprofen or Tylenol as needed for pain Rest and heat PCP follow-up if symptoms do not improve ER precautions reviewed and patient verbalized understanding Final Clinical Impressions(s) / UC Diagnoses   Final diagnoses:  Acute bilateral low back pain without sciatica     Discharge Instructions      You were given a Toradol injection in clinic today. Do not take any over the counter NSAID's such as Advil, ibuprofen, Aleve, or naproxen for 24 hours.  You may take tylenol if needed Flexeril  as needed.  Please note this medication can make you drowsy.  Do not drink alcohol or  drive while on this medication Lidoderm patch as needed.  Leave on for 12 hours and remove for 12 hours.  Do not apply heat directly to the Lidoderm patch while it is on Heat and rest Follow-up with your PCP if your symptoms do not improve Please go to the ER for any worsening symptoms     ED Prescriptions     Medication Sig Dispense Auth. Provider   cyclobenzaprine (FLEXERIL) 10 MG tablet Take 1 tablet (10 mg total) by mouth 2 (two) times daily as needed for muscle spasms. 10 tablet Radford Pax, NP   lidocaine (LIDODERM) 5 % Place 1 patch onto the skin daily. Remove & Discard patch within 12 hours or as directed by MD 15 patch Radford Pax, NP      PDMP not reviewed this encounter.   Radford Pax, NP 07/15/22 [phone]

## 2022-07-15 NOTE — Discharge Instructions (Addendum)
You were given a Toradol injection in clinic today. Do not take any over the counter NSAID's such as Advil, ibuprofen, Aleve, or naproxen for 24 hours.  You may take tylenol if needed Flexeril as needed.  Please note this medication can make you drowsy.  Do not drink alcohol or drive while on this medication Lidoderm patch as needed.  Leave on for 12 hours and remove for 12 hours.  Do not apply heat directly to the Lidoderm patch while it is on Heat and rest Follow-up with your PCP if your symptoms do not improve Please go to the ER for any worsening symptoms

## 2022-11-30 ENCOUNTER — Ambulatory Visit: Payer: Medicaid Other | Admitting: Podiatry

## 2022-11-30 ENCOUNTER — Encounter: Payer: Self-pay | Admitting: Podiatry

## 2022-11-30 DIAGNOSIS — B351 Tinea unguium: Secondary | ICD-10-CM

## 2022-11-30 NOTE — Progress Notes (Signed)
  Subjective:  Patient ID: Erica [name], female    DOB: [birth year],  MRN: [id number] HPI Chief Complaint  Patient presents with   Nail Problem    Hallux bilateral - toenail discoloration, brittle x 1 months, had acrylic on them and took them off and noticed they didn't look good, no treatment   New Patient (Initial Visit)    27 y.o. female presents with the above complaint.   ROS: Denies fever chills nausea vomit muscle aches pains calf pain back pain chest pain shortness of breath.  Past Medical History:  Diagnosis Date   Medical history non-contributory    Past Surgical History:  Procedure Laterality Date   EYE SURGERY      Current Outpatient Medications:    levonorgestrel (MIRENA) 20 MCG/24HR IUD, 1 each by Intrauterine route once. Administered 07/13/13., Disp: , Rfl:   No Known Allergies Review of Systems Objective:  There were no vitals filed for this visit.  General: Well developed, nourished, in no acute distress, alert and oriented x3   Dermatological: Skin is warm, dry and supple bilateral. Nails x 10 are well maintained; remaining integument appears unremarkable at this time. There are no open sores, no preulcerative lesions, no rash or signs of infection present.  Left hallux nail with subungual hematoma with a cracked lateral margin.  Hallux right demonstrates distal onycholysis with subungual debris and friable nail possibly associated with acrylic nail use.  Vascular: Dorsalis Pedis artery and Posterior Tibial artery pedal pulses are 2/4 bilateral with immedate capillary fill time. Pedal hair growth present. No varicosities and no lower extremity edema present bilateral.   Neruologic: Grossly intact via light touch bilateral. Vibratory intact via tuning fork bilateral. Protective threshold with Semmes Wienstein monofilament intact to all pedal sites bilateral. Patellar and Achilles deep tendon reflexes 2+ bilateral. No Babinski or clonus noted bilateral.    Musculoskeletal: No gross boney pedal deformities bilateral. No pain, crepitus, or limitation noted with foot and ankle range of motion bilateral. Muscular strength 5/5 in all groups tested bilateral.  Gait: Unassisted, Nonantalgic.    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 [place], [place]

## 2022-12-22 ENCOUNTER — Telehealth: Payer: Self-pay | Admitting: Podiatry

## 2022-12-22 MED ORDER — TERBINAFINE HCL 250 MG PO TABS: 250.0000 mg | ORAL_TABLET | Freq: Every day | ORAL | 0 refills | Status: DC

## 2022-12-22 NOTE — Telephone Encounter (Signed)
I left message for pt with information from [place] and asked pt to give [place] a call to get scheduled to follow up in middle of November.

## 2022-12-22 NOTE — Telephone Encounter (Signed)
Erica [name]-  Her result was positive for fungus. I am going to send her in 30 days of lamisil to take one tablet daily. She will need a follow up mid November to access, will also need bloodwork done at that time and to get refill of medication. If you want to let her know and make that appointment. Thank you!

## 2022-12-22 NOTE — Telephone Encounter (Signed)
Pt called and had to cancel her appt to see you on 10/10 due to work. She is generally off on Fridays. I explained that you are not in the office on Fridays. She is not able to come in for an appt thru mid November. She was comig in for [facility] results. Is there anything we can do to get her the results?

## 2022-12-23 ENCOUNTER — Encounter: Payer: Self-pay | Admitting: Podiatry

## 2022-12-30 ENCOUNTER — Ambulatory Visit: Payer: Medicaid Other | Admitting: Podiatry

## 2023-01-20 ENCOUNTER — Ambulatory Visit
Admission: RE | Admit: 2023-01-20 | Discharge: 2023-01-20 | Disposition: A | Discharge status: Home / Self Care | Payer: Medicaid Other | Source: Ambulatory Visit | Attending: Physician Assistant | Admitting: Physician Assistant

## 2023-01-20 VITALS — BP 130/85 | HR 72 | Temp 98.3°F | Resp 16 | Ht 62.0 in | Wt 125.0 lb

## 2023-01-20 DIAGNOSIS — J029 Acute pharyngitis, unspecified: Secondary | ICD-10-CM | POA: Diagnosis not present

## 2023-01-20 LAB — POC COVID19/FLU A&B COMBO
Covid Antigen, POC: NEGATIVE
Influenza A Antigen, POC: NEGATIVE
Influenza B Antigen, POC: NEGATIVE

## 2023-01-20 LAB — POCT RAPID STREP A (OFFICE): Rapid Strep A Screen: NEGATIVE

## 2023-01-20 NOTE — ED Triage Notes (Signed)
Been having a sore throat for 3 days hurts when I swallow - Entered by patient

## 2023-01-22 ENCOUNTER — Other Ambulatory Visit: Payer: Self-pay

## 2023-01-22 ENCOUNTER — Emergency Department (HOSPITAL_COMMUNITY)
Admission: EM | Admit: 2023-01-22 | Discharge: 2023-01-22 | Disposition: A | Discharge status: Home / Self Care | Payer: Medicaid Other | Attending: Emergency Medicine | Admitting: Emergency Medicine

## 2023-01-22 ENCOUNTER — Encounter (HOSPITAL_COMMUNITY): Payer: Self-pay

## 2023-01-22 DIAGNOSIS — M791 Myalgia, unspecified site: Secondary | ICD-10-CM | POA: Diagnosis present

## 2023-01-22 DIAGNOSIS — Z20822 Contact with and (suspected) exposure to covid-19: Secondary | ICD-10-CM | POA: Diagnosis not present

## 2023-01-22 DIAGNOSIS — B349 Viral infection, unspecified: Secondary | ICD-10-CM | POA: Insufficient documentation

## 2023-01-22 LAB — RESP PANEL BY RT-PCR (RSV, FLU A&B, COVID)  RVPGX2
Influenza A by PCR: NEGATIVE
Influenza B by PCR: NEGATIVE
Resp Syncytial Virus by PCR: NEGATIVE
SARS Coronavirus 2 by RT PCR: NEGATIVE

## 2023-01-22 NOTE — ED Provider Notes (Signed)
[facility] Provider Note   CSN: [id number] Arrival date & time: 01/22/23  2024     History  Chief Complaint  Patient presents with   Sore Throat   Chills   Diarrhea    Erica [name] is a 27 y.o. female,, no pertinent past medical history, who has been having myalgias, chills, subjective fevers, loose stools, and a sore throat for the past week.  She states last Saturday, all of her symptoms started, and have just persisted.  Is about having 2 loose stools a day, and denies any abdominal pain, nausea or vomiting.  States that she did go to urgent care, and had a negative strep test.  Denies any kind of urinary symptoms.  Reports dry cough, no shortness of breath.  Home Medications Prior to Admission medications   Medication Sig Start Date End Date Taking? Authorizing Provider  levonorgestrel (MIRENA) 20 MCG/24HR IUD 1 each by Intrauterine route once. Administered 07/13/13.    [provider]  terbinafine (LAMISIL) 250 MG tablet Take 1 tablet (250 mg total) by mouth daily. 12/22/22   Hyatt, Max T, DPM      Allergies    Patient has no known allergies.    Review of Systems   Review of Systems  Respiratory:  Positive for cough. Negative for shortness of breath.   Gastrointestinal:  Positive for diarrhea.    Physical Exam Updated Vital Signs BP (!) 125/90   Pulse 77   Temp 98 F (36.7 C)   Resp 18   Ht 5\' 2"  (1.575 m)   Wt 56.7 kg   LMP  (LMP Unknown)   SpO2 98%   BMI 22.86 kg/m  Physical Exam Vitals and nursing note reviewed.  Constitutional:      General: She is not in acute distress.    Appearance: She is well-developed.  HENT:     Head: Normocephalic and atraumatic.     Mouth/Throat:     Mouth: Mucous membranes are moist. No oral lesions.     Pharynx: No pharyngeal swelling, oropharyngeal exudate or posterior oropharyngeal erythema.  Eyes:     Conjunctiva/sclera: Conjunctivae normal.  Cardiovascular:      Rate and Rhythm: Normal rate and regular rhythm.     Heart sounds: No murmur heard. Pulmonary:     Effort: Pulmonary effort is normal. No respiratory distress.     Breath sounds: Normal breath sounds.  Abdominal:     Palpations: Abdomen is soft.     Tenderness: There is no abdominal tenderness.  Musculoskeletal:        General: No swelling.     Cervical back: Neck supple.  Skin:    General: Skin is warm and dry.     Capillary Refill: Capillary refill takes less than 2 seconds.  Neurological:     Mental Status: She is alert.  Psychiatric:        Mood and Affect: Mood normal.     ED Results / Procedures / Treatments   Labs (all labs ordered are listed, but only abnormal results are displayed) Labs Reviewed  RESP PANEL BY RT-PCR (RSV, FLU A&B, COVID)  RVPGX2    EKG None  Radiology No results found.  Procedures Procedures    Medications Ordered in ED Medications - No data to display  ED Course/ Medical Decision Making/ A&P  Medical Decision Making Patient is a 27 year old female, well-appearing, here for sore throat, loose stools, and myalgias for the last 7 days.  She is overall well-appearing.  We obtain COVID/flu test, for further evaluation.  She is already tested negative for strep, and is well-appearing.  Is not having abdominal pain, and is only having 2 loose stool episodes a day, thus no labs necessary at this time.  Amount and/or Complexity of Data Reviewed Labs:     Details: COVID/flu negative Discussion of management or test interpretation with external provider(s): Discussed with patient, COVID/flu negative, will have f/u with PCP. Treat conservatively with Tylenol, and ibuprofen.  We discussed return precautions and she voiced understanding.  As she has no coarse breath sounds, and is very well appearing. Likely represents viral illness  Final Clinical Impression(s) / ED Diagnoses Final diagnoses:  Viral illness     Rx / DC Orders ED Discharge Orders     None         Aydn Ferrara L, PA 01/22/23 2247    Elayne Snare K, DO 01/22/23 2348

## 2023-01-22 NOTE — ED Triage Notes (Signed)
Pt arrived POV from home stating she has been having a sore throat, chills and diarrhea since last Saturday.

## 2023-01-22 NOTE — Discharge Instructions (Signed)
Please follow-up with your primary care doctor, return to the ER if you feel like your symptoms are worsening. Eat very bland foods, to help with the diarrhea, and use salt water gargles to help with the sore throat.  Take Tylenol and ibuprofen for as needed for pain control.  Return to the ER if you have intractable nausea, vomiting, or severe abdominal pain.

## 2023-02-16 NOTE — ED Provider Notes (Signed)
[facility]    CSN: [id number] Arrival date & time: 01/20/23  1040      History   Chief Complaint Chief Complaint  Patient presents with   Sore Throat    HPI Erica [name] is a 27 y.o. female.   Patient here today for evaluation of sore throat that started 3 days ago.  She does not have significant congestion or cough.  She has tried over-the-counter medication without resolution.  The history is provided by the patient.  Sore Throat Pertinent negatives include no abdominal pain and no shortness of breath.    Past Medical History:  Diagnosis Date   Medical history non-contributory     Patient Active Problem List   Diagnosis Date Noted   History of abnormal cervical Pap smear 12/04/2020   Pap smear of cervix with ASCUS, cannot exclude HGSIL 12/03/2019   Encounter for removal and reinsertion of intrauterine contraceptive device (IUD) 09/19/2019   Chlamydial infection 08/16/2014    Past Surgical History:  Procedure Laterality Date   EYE SURGERY      OB History     Gravida  1   Para  1   Term  1   Preterm      AB      Living  1      SAB      IAB      Ectopic      Multiple      Live Births  1            Home Medications    Prior to Admission medications   Medication Sig Start Date End Date Taking? Authorizing Provider  levonorgestrel (MIRENA) 20 MCG/24HR IUD 1 each by Intrauterine route once. Administered 07/13/13.   Yes [provider]  terbinafine (LAMISIL) 250 MG tablet Take 1 tablet (250 mg total) by mouth daily. 12/22/22   Hyatt, Max T, DPM    Family History Family History  Problem Relation Age of Onset   Healthy Mother     Social History Social History   Tobacco Use   Smoking status: Never   Smokeless tobacco: Never  Vaping Use   Vaping status: Never Used  Substance Use Topics   Alcohol use: No    Alcohol/week: 0.0 standard drinks of alcohol   Drug use: No     Allergies   Patient has  no known allergies.   Review of Systems Review of Systems  Constitutional:  Negative for chills and fever.  HENT:  Positive for sore throat. Negative for congestion and ear pain.   Eyes:  Negative for discharge and redness.  Respiratory:  Negative for cough, shortness of breath and wheezing.   Gastrointestinal:  Negative for abdominal pain, diarrhea, nausea and vomiting.     Physical Exam Triage Vital Signs ED Triage Vitals  Encounter Vitals Group     BP 01/20/23 1139 130/85     Systolic BP Percentile --      Diastolic BP Percentile --      Pulse Rate 01/20/23 1139 72     Resp 01/20/23 1139 16     Temp 01/20/23 1139 98.3 F (36.8 C)     Temp Source 01/20/23 1139 Oral     SpO2 01/20/23 1139 98 %     Weight 01/20/23 1141 125 lb (56.7 kg)     Height 01/20/23 1141 5\' 2"  (1.575 m)     Head Circumference --      Peak Flow --  Pain Score 01/20/23 1140 8     Pain Loc --      Pain Education --      Exclude from Growth Chart --    No data found.  Updated Vital Signs BP 130/85 (BP Location: Left Arm)   Pulse 72   Temp 98.3 F (36.8 C) (Oral)   Resp 16   Ht 5\' 2"  (1.575 m)   Wt 125 lb (56.7 kg)   LMP  (LMP Unknown)   SpO2 98%   BMI 22.86 kg/m   Physical Exam Vitals and nursing note reviewed.  Constitutional:      General: She is not in acute distress.    Appearance: Normal appearance. She is not ill-appearing.  HENT:     Head: Normocephalic and atraumatic.     Nose: No congestion or rhinorrhea.     Mouth/Throat:     Mouth: Mucous membranes are moist.     Pharynx: Posterior oropharyngeal erythema present. No oropharyngeal exudate.  Eyes:     Conjunctiva/sclera: Conjunctivae normal.  Cardiovascular:     Rate and Rhythm: Normal rate and regular rhythm.     Heart sounds: Normal heart sounds. No murmur heard. Pulmonary:     Effort: Pulmonary effort is normal. No respiratory distress.     Breath sounds: Normal breath sounds. No wheezing, rhonchi or rales.   Skin:    General: Skin is warm and dry.  Neurological:     Mental Status: She is alert.  Psychiatric:        Mood and Affect: Mood normal.        Thought Content: Thought content normal.      UC Treatments / Results  Labs (all labs ordered are listed, but only abnormal results are displayed) Labs Reviewed  POCT RAPID STREP A (OFFICE) - Normal  POC COVID19/FLU A&B COMBO - Normal    EKG   Radiology No results found.  Procedures Procedures (including critical care time)  Medications Ordered in UC Medications - No data to display  Initial Impression / Assessment and Plan / UC Course  I have reviewed the triage vital signs and the nursing notes.  Pertinent labs & imaging results that were available during my care of the patient were reviewed by me and considered in my medical decision making (see chart for details).    Rapid strep, COVID, flu negative in office.  Recommended symptomatic treatment, increase fluids and rest.  Encouraged follow-up if symptoms fail to improve or worsen.  Final Clinical Impressions(s) / UC Diagnoses   Final diagnoses:  Acute pharyngitis, unspecified etiology   Discharge Instructions   None    ED Prescriptions   None    PDMP not reviewed this encounter.   Tomi Bamberger, PA-C 02/16/23 [phone]

## 2023-03-28 ENCOUNTER — Ambulatory Visit
Admission: RE | Admit: 2023-03-28 | Discharge: 2023-03-28 | Disposition: A | Discharge status: Home / Self Care | Payer: Medicaid Other | Source: Ambulatory Visit | Attending: Family Medicine | Admitting: Family Medicine

## 2023-03-28 VITALS — BP 115/79 | HR 115 | Temp 101.1°F | Resp 16 | Wt 125.0 lb

## 2023-03-28 DIAGNOSIS — R000 Tachycardia, unspecified: Secondary | ICD-10-CM | POA: Diagnosis not present

## 2023-03-28 DIAGNOSIS — R059 Cough, unspecified: Secondary | ICD-10-CM | POA: Diagnosis not present

## 2023-03-28 DIAGNOSIS — R509 Fever, unspecified: Secondary | ICD-10-CM

## 2023-03-28 DIAGNOSIS — J029 Acute pharyngitis, unspecified: Secondary | ICD-10-CM | POA: Diagnosis not present

## 2023-03-28 DIAGNOSIS — R6889 Other general symptoms and signs: Secondary | ICD-10-CM | POA: Diagnosis not present

## 2023-03-28 DIAGNOSIS — R5383 Other fatigue: Secondary | ICD-10-CM | POA: Insufficient documentation

## 2023-03-28 LAB — POCT INFLUENZA A/B
Influenza A, POC: NEGATIVE
Influenza B, POC: NEGATIVE

## 2023-03-28 MED ORDER — IBUPROFEN 100 MG/5ML PO SUSP
600.0000 mg | Freq: Once | ORAL | Status: AC
  Administered 2023-03-28: 600 mg via ORAL

## 2023-03-28 NOTE — ED Triage Notes (Signed)
 Pt presents with complaints of cough fever chills and sore throat x 1 day. Pt has not had any tylenol.

## 2023-03-28 NOTE — Discharge Instructions (Signed)
 Test results will be released to your MyChart account We will contact you if anything is positive and requires treatment.  Over-the-counter medicines as directed on the package for symptoms

## 2023-03-28 NOTE — ED Provider Notes (Signed)
 [facility]    CSN: [id number] Arrival date & time: 03/28/23  1048      History   Chief Complaint Chief Complaint  Patient presents with   Cough    Been having a cough snd sore throat since 03-27-23 when I cough it's hurting my chest - Entered by patient    HPI Erica [name] is a 28 y.o. female.    Cough Associated symptoms: chills, fever and sore throat   Not feeling well since yesterday's include nonproductive cough, fever, body aches, sore throat, chills, fatigue.  Admits 1 episode of vomiting this morning. Denies headache, chest pain, shortness of breath, abdominal pain, rash or skin changes. Son has been sick for aches was seen in Denies work contacts with illness.  Denies recent travel.  No antipyretics taken today.  Past Medical History:  Diagnosis Date   Medical history non-contributory     Patient Active Problem List   Diagnosis Date Noted   History of abnormal cervical Pap smear 12/04/2020   Pap smear of cervix with ASCUS, cannot exclude HGSIL 12/03/2019   Encounter for removal and reinsertion of intrauterine contraceptive device (IUD) 09/19/2019   Chlamydial infection 08/16/2014    Past Surgical History:  Procedure Laterality Date   EYE SURGERY      OB History     Gravida  1   Para  1   Term  1   Preterm      AB      Living  1      SAB      IAB      Ectopic      Multiple      Live Births  1            Home Medications    Prior to Admission medications   Medication Sig Start Date End Date Taking? Authorizing Provider  levonorgestrel (MIRENA) 20 MCG/24HR IUD 1 each by Intrauterine route once. Administered 07/13/13.    [provider]  terbinafine  (LAMISIL ) 250 MG tablet Take 1 tablet (250 mg total) by mouth daily. 12/22/22   Hyatt, Max T, DPM    Family History Family History  Problem Relation Age of Onset   Healthy Mother     Social History Social History   Tobacco Use   Smoking status: Never    Smokeless tobacco: Never  Vaping Use   Vaping status: Never Used  Substance Use Topics   Alcohol use: No    Alcohol/week: 0.0 standard drinks of alcohol   Drug use: No     Allergies   Patient has no known allergies.   Review of Systems Review of Systems  Constitutional:  Positive for chills, fatigue and fever.  HENT:  Positive for congestion and sore throat. Negative for trouble swallowing and voice change.   Respiratory:  Positive for cough.   Gastrointestinal:  Positive for vomiting. Negative for abdominal pain.     Physical Exam Triage Vital Signs ED Triage Vitals  Encounter Vitals Group     BP 03/28/23 1054 115/79     Systolic BP Percentile --      Diastolic BP Percentile --      Pulse Rate 03/28/23 1054 (!) 115     Resp 03/28/23 1054 16     Temp 03/28/23 1054 (!) 101.1 F (38.4 C)     Temp Source 03/28/23 1054 Oral     SpO2 03/28/23 1054 95 %     Weight 03/28/23 1054 125 lb (  56.7 kg)     Height --      Head Circumference --      Peak Flow --      Pain Score 03/28/23 1103 10     Pain Loc --      Pain Education --      Exclude from Growth Chart --    No data found.  Updated Vital Signs BP 115/79 (BP Location: Right Arm)   Pulse (!) 115   Temp (!) 101.1 F (38.4 C) (Oral)   Resp 16   Wt 125 lb (56.7 kg)   LMP  (LMP Unknown) Comment: no cycles with iud  SpO2 95%   BMI 22.86 kg/m   Visual Acuity Right Eye Distance:   Left Eye Distance:   Bilateral Distance:    Right Eye Near:   Left Eye Near:    Bilateral Near:     Physical Exam Vitals and nursing note reviewed.  Constitutional:      Appearance: She is not ill-appearing.  HENT:     Head: Normocephalic and atraumatic.     Right Ear: Tympanic membrane and ear canal normal.     Left Ear: Tympanic membrane and ear canal normal.     Nose: Congestion present. No rhinorrhea.     Mouth/Throat:     Mouth: Mucous membranes are moist.     Pharynx: No oropharyngeal exudate.  Eyes:      Conjunctiva/sclera: Conjunctivae normal.  Cardiovascular:     Rate and Rhythm: Tachycardia present.     Heart sounds: Normal heart sounds.  Pulmonary:     Effort: Pulmonary effort is normal. No respiratory distress.     Breath sounds: Normal breath sounds. No wheezing, rhonchi or rales.  Musculoskeletal:     Cervical back: Neck supple.  Lymphadenopathy:     Cervical: No cervical adenopathy.  Neurological:     Mental Status: She is alert.  Psychiatric:        Mood and Affect: Mood normal.      UC Treatments / Results  Labs (all labs ordered are listed, but only abnormal results are displayed) Labs Reviewed  POCT INFLUENZA A/B    EKG   Radiology No results found.  Procedures Procedures (including critical care time)  Medications Ordered in UC Medications  ibuprofen  (ADVIL ) 100 MG/5ML suspension 600 mg (has no administration in time range)    Initial Impression / Assessment and Plan / UC Course  I have reviewed the triage vital signs and the nursing notes.  Pertinent labs & imaging results that were available during my care of the patient were reviewed by me and considered in my medical decision making (see chart for details).     28 year old female with flulike symptoms 1 day, she is febrile to 101 and tachycardic to 115.  Point-of-care flu is negative, will send COVID PCR, over-the-counter medicines for symptoms, warning signs and follow-up reviewed with patient, cannot return to work until fever free for 24 hours Final Clinical Impressions(s) / UC Diagnoses   Final diagnoses:  None   Discharge Instructions   None    ED Prescriptions   None    PDMP not reviewed this encounter.   Rollan Roger, [place] 03/28/23 1124

## 2023-03-29 LAB — SARS CORONAVIRUS 2 (TAT 6-24 HRS): SARS Coronavirus 2: NEGATIVE

## 2023-04-19 ENCOUNTER — Ambulatory Visit: Payer: Medicaid Other | Admitting: Podiatry

## 2023-04-19 ENCOUNTER — Encounter: Payer: Self-pay | Admitting: Podiatry

## 2023-04-19 DIAGNOSIS — B351 Tinea unguium: Secondary | ICD-10-CM | POA: Diagnosis not present

## 2023-04-19 MED ORDER — TERBINAFINE HCL 250 MG PO TABS: 250.0000 mg | ORAL_TABLET | Freq: Every day | ORAL | 0 refills | Status: AC

## 2023-04-20 NOTE — Progress Notes (Signed)
She presents today with her little boy for follow-up of her nail fungus.  She is completed her first 30 tablets and her nail pathology has come back and she wants to hear what it said.  Objective: Vital signs are stable she is alert and oriented x 3.  Nail pathology does demonstrate a separate right but does not demonstrate PCR at this time.  Physical exam does demonstrate what appears to be a healing onychomycosis.  There is no sign of fungus on the skin.  Assessment: Resolving tinea pedis with onychomycosis.  Plan: At this point we are going start her on 90 days of Lamisil 250 mg tablets 1 p.o. daily.  I will follow-up with her in 4 months

## 2023-07-11 ENCOUNTER — Encounter: Payer: Self-pay | Admitting: Podiatry

## 2023-08-16 ENCOUNTER — Ambulatory Visit: Payer: Medicaid Other | Admitting: Podiatry

## 2023-09-22 ENCOUNTER — Ambulatory Visit: Admitting: Podiatry

## 2023-10-02 ENCOUNTER — Other Ambulatory Visit: Payer: Self-pay | Admitting: Podiatry

## 2024-03-08 ENCOUNTER — Ambulatory Visit: Payer: Self-pay

## 2024-03-11 ENCOUNTER — Ambulatory Visit
Admission: EM | Admit: 2024-03-11 | Discharge: 2024-03-11 | Disposition: A | Discharge status: Home / Self Care | Payer: Self-pay | Attending: Internal Medicine | Admitting: Internal Medicine

## 2024-03-11 ENCOUNTER — Encounter: Payer: Self-pay | Admitting: Emergency Medicine

## 2024-03-11 DIAGNOSIS — J0100 Acute maxillary sinusitis, unspecified: Secondary | ICD-10-CM

## 2024-03-11 LAB — POCT RAPID STREP A (OFFICE): Rapid Strep A Screen: NEGATIVE

## 2024-03-11 MED ORDER — AMOXICILLIN-POT CLAVULANATE 400-57 MG/5ML PO SUSR: 875.0000 mg | Freq: Two times a day (BID) | ORAL | 0 refills | Status: AC

## 2024-03-11 MED ORDER — FLUTICASONE PROPIONATE 50 MCG/ACT NA SUSP: 2.0000 | Freq: Every day | NASAL | 2 refills | Status: AC

## 2024-03-11 MED ORDER — AMOXICILLIN-POT CLAVULANATE 875-125 MG PO TABS: 1.0000 | ORAL_TABLET | Freq: Two times a day (BID) | ORAL | 0 refills | Status: DC

## 2024-03-11 NOTE — Discharge Instructions (Addendum)
 Strep testing done today was negative.  Symptoms and physical exam findings are most consistent with a maxillary sinus infection.  Due to the severity and duration of the symptoms we will treat this with antibiotics by mouth.  We recommend the following: Augmentin  875 mg twice daily for 7 days.  This is an antibiotic.  Take this with food.  Flonase  2 sprays each nostril once daily for 7 days for nasal congestion.  May use as needed after this. May use over-the-counter Sudafed to help with nasal congestion Make sure to stay hydrated by drinking plenty of water.  Return to urgent care or PCP if symptoms worsen or fail to resolve.

## 2024-03-11 NOTE — ED Triage Notes (Signed)
 Pt c/o headache, congestion, sore throat, and feeling hot and cold for 1 week.   States her son recently had RSV

## 2024-03-11 NOTE — ED Provider Notes (Signed)
 [facility]    CSN: [id number] Arrival date & time: 03/11/24  0935      History   Chief Complaint Chief Complaint  Patient presents with   Sore Throat   Nasal Congestion    HPI JUNKO OHAGAN is a 28 y.o. female.   28 year old female who presents urgent care with complaints of sinus congestion, intermittent headache, sore throat.  She reports her symptoms started on Sunday last week.  She did note that her son was diagnosed with RSV recently.  She reports that her sinuses are significantly stopped up and is difficult to breathe through her nose.  She is having headaches associated with this.  She denies any fevers, nausea, vomiting, cough.  She has tried over-the-counter medication without relief   Sore Throat Associated symptoms include headaches. Pertinent negatives include no chest pain, no abdominal pain and no shortness of breath.    Past Medical History:  Diagnosis Date   Medical history non-contributory     Patient Active Problem List   Diagnosis Date Noted   History of abnormal cervical Pap smear 12/04/2020   Pap smear of cervix with ASCUS, cannot exclude HGSIL 12/03/2019   Encounter for removal and reinsertion of intrauterine contraceptive device (IUD) 09/19/2019   Chlamydial infection 08/16/2014    Past Surgical History:  Procedure Laterality Date   EYE SURGERY      OB History     Gravida  1   Para  1   Term  1   Preterm      AB      Living  1      SAB      IAB      Ectopic      Multiple      Live Births  1            Home Medications    Prior to Admission medications  Medication Sig Start Date End Date Taking? Authorizing Provider  amoxicillin -clavulanate (AUGMENTIN ) 875-125 MG tablet Take 1 tablet by mouth every 12 (twelve) hours. 03/11/24  Yes Jerik Falletta A, PA-C  fluticasone  (FLONASE ) 50 MCG/ACT nasal spray Place 2 sprays into both nostrils daily. 03/11/24  Yes Oluwadarasimi Favor A, PA-C   levonorgestrel (MIRENA) 20 MCG/24HR IUD 1 each by Intrauterine route once. Administered 07/13/13. Patient not taking: Reported on 04/19/2023    [provider]  terbinafine  (LAMISIL ) 250 MG tablet Take 1 tablet (250 mg total) by mouth daily. 04/19/23   Hyatt, Royden ONEIDA, DPM    Family History Family History  Problem Relation Age of Onset   Healthy Mother     Social History Social History[1]   Allergies   Patient has no known allergies.   Review of Systems Review of Systems  Constitutional:  Negative for chills and fever.  HENT:  Positive for congestion, rhinorrhea, sinus pressure, sinus pain and sore throat. Negative for ear pain.   Eyes:  Negative for pain and visual disturbance.  Respiratory:  Negative for cough and shortness of breath.   Cardiovascular:  Negative for chest pain and palpitations.  Gastrointestinal:  Negative for abdominal pain and vomiting.  Genitourinary:  Negative for dysuria and hematuria.  Musculoskeletal:  Negative for arthralgias and back pain.  Skin:  Negative for color change and rash.  Neurological:  Positive for headaches. Negative for seizures and syncope.  All other systems reviewed and are negative.    Physical Exam Triage Vital Signs ED Triage Vitals  Encounter Vitals Group  BP 03/11/24 0947 116/80     Girls Systolic BP Percentile --      Girls Diastolic BP Percentile --      Boys Systolic BP Percentile --      Boys Diastolic BP Percentile --      Pulse Rate 03/11/24 0947 81     Resp 03/11/24 0947 17     Temp 03/11/24 0947 98.6 F (37 C)     Temp Source 03/11/24 0947 Oral     SpO2 03/11/24 0947 98 %     Weight --      Height --      Head Circumference --      Peak Flow --      Pain Score 03/11/24 0950 8     Pain Loc --      Pain Education --      Exclude from Growth Chart --    No data found.  Updated Vital Signs BP 116/80 (BP Location: Right Arm)   Pulse 81   Temp 98.6 F (37 C) (Oral)   Resp 17   SpO2 98%    Visual Acuity Right Eye Distance:   Left Eye Distance:   Bilateral Distance:    Right Eye Near:   Left Eye Near:    Bilateral Near:     Physical Exam Vitals and nursing note reviewed.  Constitutional:      General: She is not in acute distress.    Appearance: She is well-developed.  HENT:     Head: Normocephalic and atraumatic.     Nose: Nasal tenderness, mucosal edema, congestion and rhinorrhea present. Rhinorrhea is purulent.     Right Sinus: Maxillary sinus tenderness present.     Left Sinus: Maxillary sinus tenderness present.     Mouth/Throat:     Mouth: Mucous membranes are moist.     Pharynx: Posterior oropharyngeal erythema present. No pharyngeal swelling.  Eyes:     Conjunctiva/sclera: Conjunctivae normal.  Cardiovascular:     Rate and Rhythm: Normal rate and regular rhythm.     Heart sounds: No murmur heard. Pulmonary:     Effort: Pulmonary effort is normal. No respiratory distress.     Breath sounds: Normal breath sounds.  Abdominal:     Palpations: Abdomen is soft.     Tenderness: There is no abdominal tenderness.  Musculoskeletal:        General: No swelling.     Cervical back: Neck supple.  Skin:    General: Skin is warm and dry.     Capillary Refill: Capillary refill takes less than 2 seconds.  Neurological:     Mental Status: She is alert.  Psychiatric:        Mood and Affect: Mood normal.      UC Treatments / Results  Labs (all labs ordered are listed, but only abnormal results are displayed) Labs Reviewed  POCT RAPID STREP A (OFFICE) - Normal    EKG   Radiology No results found.  Procedures Procedures (including critical care time)  Medications Ordered in UC Medications - No data to display  Initial Impression / Assessment and Plan / UC Course  I have reviewed the triage vital signs and the nursing notes.  Pertinent labs & imaging results that were available during my care of the patient were reviewed by me and considered in  my medical decision making (see chart for details).     Acute non-recurrent maxillary sinusitis   Strep testing done today was negative.  Symptoms and physical exam findings are most consistent with a maxillary sinus infection.  Due to the severity and duration of the symptoms we will treat this with antibiotics by mouth.  We recommend the following: Augmentin  875 mg twice daily for 7 days.  This is an antibiotic.  Take this with food.  Flonase  2 sprays each nostril once daily for 7 days for nasal congestion.  May use as needed after this. May use over-the-counter Sudafed to help with nasal congestion Make sure to stay hydrated by drinking plenty of water.  Return to urgent care or PCP if symptoms worsen or fail to resolve.    Final Clinical Impressions(s) / UC Diagnoses   Final diagnoses:  Acute non-recurrent maxillary sinusitis     Discharge Instructions      Strep testing done today was negative.  Symptoms and physical exam findings are most consistent with a maxillary sinus infection.  Due to the severity and duration of the symptoms we will treat this with antibiotics by mouth.  We recommend the following: Augmentin  875 mg twice daily for 7 days.  This is an antibiotic.  Take this with food.  Flonase  2 sprays each nostril once daily for 7 days for nasal congestion.  May use as needed after this. May use over-the-counter Sudafed to help with nasal congestion Make sure to stay hydrated by drinking plenty of water.  Return to urgent care or PCP if symptoms worsen or fail to resolve.      ED Prescriptions     Medication Sig Dispense Auth. Provider   amoxicillin -clavulanate (AUGMENTIN ) 875-125 MG tablet Take 1 tablet by mouth every 12 (twelve) hours. 14 tablet Linnie Delgrande A, PA-C   fluticasone  (FLONASE ) 50 MCG/ACT nasal spray Place 2 sprays into both nostrils daily. 9.9 mL Teresa Almarie LABOR, [place]      PDMP not reviewed this encounter.    [1]  Social History Tobacco  Use   Smoking status: Never   Smokeless tobacco: Never  Vaping Use   Vaping status: Never Used  Substance Use Topics   Alcohol use: No    Alcohol/week: 0.0 standard drinks of alcohol   Drug use: No     Teresa Almarie LABOR, PA-C 03/11/24 1021  "
